# Patient Record
Sex: Female | Born: 1960 | Race: White | Hispanic: No | Marital: Married | Smoking: Current every day smoker
Health system: Southern US, Community
[De-identification: ages and names within clinical notes are randomized; demographics above are authoritative.]

## PROBLEM LIST (undated history)

## (undated) DIAGNOSIS — Z972 Presence of dental prosthetic device (complete) (partial): Secondary | ICD-10-CM

## (undated) DIAGNOSIS — E119 Type 2 diabetes mellitus without complications: Secondary | ICD-10-CM

## (undated) DIAGNOSIS — D649 Anemia, unspecified: Secondary | ICD-10-CM

## (undated) DIAGNOSIS — R51 Headache: Secondary | ICD-10-CM

## (undated) DIAGNOSIS — J343 Hypertrophy of nasal turbinates: Secondary | ICD-10-CM

## (undated) DIAGNOSIS — J320 Chronic maxillary sinusitis: Secondary | ICD-10-CM

## (undated) DIAGNOSIS — D72829 Elevated white blood cell count, unspecified: Secondary | ICD-10-CM

## (undated) DIAGNOSIS — K2270 Barrett's esophagus without dysplasia: Secondary | ICD-10-CM

## (undated) DIAGNOSIS — E782 Mixed hyperlipidemia: Secondary | ICD-10-CM

## (undated) DIAGNOSIS — R519 Headache, unspecified: Secondary | ICD-10-CM

## (undated) DIAGNOSIS — J322 Chronic ethmoidal sinusitis: Secondary | ICD-10-CM

## (undated) HISTORY — DX: Mixed hyperlipidemia: E78.2

## (undated) HISTORY — PX: SHOULDER ARTHROSCOPY: SHX128

---

## 1994-10-24 HISTORY — PX: VESICOVAGINAL FISTULA CLOSURE W/ TAH: SUR271

## 1999-04-26 ENCOUNTER — Ambulatory Visit (HOSPITAL_BASED_OUTPATIENT_CLINIC_OR_DEPARTMENT_OTHER): Admission: RE | Admit: 1999-04-26 | Discharge: 1999-04-26 | Payer: Self-pay | Admitting: Orthopedic Surgery

## 1999-05-12 ENCOUNTER — Encounter: Admission: RE | Admit: 1999-05-12 | Discharge: 1999-05-24 | Payer: Self-pay | Admitting: Orthopedic Surgery

## 2000-05-05 ENCOUNTER — Encounter: Payer: Self-pay | Admitting: Orthopedic Surgery

## 2000-05-05 ENCOUNTER — Ambulatory Visit (HOSPITAL_COMMUNITY): Admission: RE | Admit: 2000-05-05 | Discharge: 2000-05-05 | Payer: Self-pay | Admitting: Orthopedic Surgery

## 2002-09-09 ENCOUNTER — Encounter: Payer: Self-pay | Admitting: Orthopedic Surgery

## 2002-09-09 ENCOUNTER — Encounter: Admission: RE | Admit: 2002-09-09 | Discharge: 2002-09-09 | Payer: Self-pay | Admitting: Orthopedic Surgery

## 2003-09-07 ENCOUNTER — Emergency Department (HOSPITAL_COMMUNITY): Admission: EM | Admit: 2003-09-07 | Discharge: 2003-09-07 | Payer: Self-pay | Admitting: Emergency Medicine

## 2003-09-23 ENCOUNTER — Encounter: Admission: RE | Admit: 2003-09-23 | Discharge: 2003-09-23 | Payer: Self-pay | Admitting: Family Medicine

## 2003-10-01 ENCOUNTER — Encounter: Admission: RE | Admit: 2003-10-01 | Discharge: 2003-10-01 | Payer: Self-pay | Admitting: Obstetrics and Gynecology

## 2004-03-30 ENCOUNTER — Emergency Department (HOSPITAL_COMMUNITY): Admission: EM | Admit: 2004-03-30 | Discharge: 2004-03-30 | Payer: Self-pay

## 2004-04-19 ENCOUNTER — Observation Stay (HOSPITAL_COMMUNITY): Admission: RE | Admit: 2004-04-19 | Discharge: 2004-04-19 | Payer: Self-pay | Admitting: Surgery

## 2004-04-19 ENCOUNTER — Encounter (INDEPENDENT_AMBULATORY_CARE_PROVIDER_SITE_OTHER): Payer: Self-pay | Admitting: Specialist

## 2004-04-19 HISTORY — PX: LAPAROSCOPIC CHOLECYSTECTOMY: SUR755

## 2007-05-25 ENCOUNTER — Other Ambulatory Visit: Admission: RE | Admit: 2007-05-25 | Discharge: 2007-05-25 | Payer: Self-pay | Admitting: Family Medicine

## 2009-06-26 ENCOUNTER — Encounter: Admission: RE | Admit: 2009-06-26 | Discharge: 2009-06-26 | Payer: Self-pay | Admitting: Family Medicine

## 2010-05-14 ENCOUNTER — Encounter: Payer: Self-pay | Admitting: Cardiology

## 2010-05-24 DIAGNOSIS — IMO0001 Reserved for inherently not codable concepts without codable children: Secondary | ICD-10-CM | POA: Insufficient documentation

## 2010-05-24 DIAGNOSIS — R1031 Right lower quadrant pain: Secondary | ICD-10-CM

## 2010-05-24 DIAGNOSIS — E119 Type 2 diabetes mellitus without complications: Secondary | ICD-10-CM | POA: Insufficient documentation

## 2010-05-24 DIAGNOSIS — IMO0002 Reserved for concepts with insufficient information to code with codable children: Secondary | ICD-10-CM | POA: Insufficient documentation

## 2010-05-24 DIAGNOSIS — J309 Allergic rhinitis, unspecified: Secondary | ICD-10-CM | POA: Insufficient documentation

## 2010-05-24 DIAGNOSIS — R110 Nausea: Secondary | ICD-10-CM | POA: Insufficient documentation

## 2010-05-24 DIAGNOSIS — E782 Mixed hyperlipidemia: Secondary | ICD-10-CM

## 2010-05-24 DIAGNOSIS — M25519 Pain in unspecified shoulder: Secondary | ICD-10-CM | POA: Insufficient documentation

## 2010-05-24 DIAGNOSIS — R079 Chest pain, unspecified: Secondary | ICD-10-CM | POA: Insufficient documentation

## 2010-05-24 DIAGNOSIS — G479 Sleep disorder, unspecified: Secondary | ICD-10-CM | POA: Insufficient documentation

## 2010-05-26 ENCOUNTER — Encounter: Payer: Self-pay | Admitting: Cardiology

## 2010-05-26 ENCOUNTER — Ambulatory Visit: Payer: Self-pay | Admitting: Cardiology

## 2010-05-26 DIAGNOSIS — F17200 Nicotine dependence, unspecified, uncomplicated: Secondary | ICD-10-CM

## 2010-06-02 ENCOUNTER — Telehealth (INDEPENDENT_AMBULATORY_CARE_PROVIDER_SITE_OTHER): Payer: Self-pay | Admitting: *Deleted

## 2010-06-03 ENCOUNTER — Encounter (HOSPITAL_COMMUNITY): Admission: RE | Admit: 2010-06-03 | Discharge: 2010-07-26 | Payer: Self-pay | Admitting: Cardiology

## 2010-06-03 ENCOUNTER — Ambulatory Visit: Payer: Self-pay | Admitting: Internal Medicine

## 2010-06-03 ENCOUNTER — Encounter: Payer: Self-pay | Admitting: Internal Medicine

## 2010-06-03 ENCOUNTER — Ambulatory Visit: Payer: Self-pay

## 2010-07-21 ENCOUNTER — Encounter: Admission: RE | Admit: 2010-07-21 | Discharge: 2010-07-21 | Payer: Self-pay | Admitting: Family Medicine

## 2010-07-29 ENCOUNTER — Encounter: Admission: RE | Admit: 2010-07-29 | Discharge: 2010-07-29 | Payer: Self-pay | Admitting: Family Medicine

## 2010-11-23 NOTE — Assessment & Plan Note (Signed)
Summary: [facility]   Visit Type:  Initial Consult  CC:  chest pain.  History of Present Illness: 50 year old female for evaluation of chest pain. No prior cardiac history. She does have some dyspnea on exertion relieved with rest. There is no orthopnea, PND, pedal edema, palpitations or syncope. Over the past one and one half years she has noticed occasional pain under the left breast. It is described as a stabbing pain. It does not radiate. There is associated shortness of breath and diaphoresis but no nausea. It lasts 5 seconds and resolves spontaneously. It is not pleuritic but it can occur with change in position. It resolves spontaneously. Because of the above were asked to further evaluate.  Current Medications (verified): 1)  Actos 15 Mg Tabs (Pioglitazone Hcl) .... Take 1 Tablet By Mouth Two Times A Day 2)  Ibuprofen 200 Mg Tabs (Ibuprofen) .... As Needed  Allergies (verified): No Known Drug Allergies  Past History:  Past Medical History: HYPERLIPIDEMIA, MIXED  UNSPECIFIED SLEEP DISTURBANCE DM  H/O pneumonia/bronchitis  Past Surgical History: Laparoscopic cholecystectomy. hysterectomy Right Shoulder surgery x 4  Family History: Reviewed history and no changes required. Father with CAD (MI in 34s) 2 sisters with MI prior to age 93  Social History: Reviewed history from 05/24/2010 and no changes required. Full Time Tobacco Use - Yes.  Drug Use - rarely Married   Review of Systems       no fevers or chills, productive cough, hemoptysis, dysphasia, odynophagia, melena, hematochezia, dysuria, hematuria, rash, seizure activity, orthopnea, PND, pedal edema, claudication. Remaining systems are negative.   Vital Signs:  Patient profile:   50 year old female Height:      61 inches Weight:      147 pounds BMI:     27.88 Pulse rate:   99 / minute Pulse rhythm:   regular Resp:     18 per minute BP sitting:   120 / 62  (left arm) Cuff size:    large  Vitals Entered By: Vikki Ports (May 26, 2010 9:26 AM)  Physical Exam  General:  Well developed/well nourished in NAD Skin warm/dry Patient not depressed No peripheral clubbing Back-normal HEENT-normal/normal eyelids Neck Riggs Dineen/normal carotid upstroke bilaterally; no bruits; no JVD; no thyromegaly chest - CTA/ normal expansion CV - RRR/normal S1 and S2; no murmurs, rubs or gallops;  PMI nondisplaced Abdomen -NT/ND, no HSM, no mass, + bowel sounds, no bruit 2+ femoral pulses, no bruits Ext-no edema, chords, 2+ DP Neuro-grossly nonfocal     EKG  Procedure date:  05/26/2010  Findings:      Sinus rhythm at a rate of 99. No ST changes.  Impression & Recommendations:  Problem # 1:  CHEST PAIN (ICD-786.50) Symptoms atypical. However multiple risk factors including tobacco use, diabetes and strong family history. Plan to proceed with stress Myoview for risk stratification. If negative no further cardiac workup. I have stressed the importance of risk factor modification including diet and exercise. Orders: Nuclear Stress Test (Nuc Stress Test)  Problem # 2:  TOBACCO ABUSE (ICD-305.1) Patient counseled on discontinuing for between 3-10 minutes.  Problem # 3:  HYPERLIPIDEMIA, MIXED (ICD-272.2) Management per primary care.  Problem # 4:  DM (ICD-250.00)  Her updated medication list for this problem includes:    Actos 15 Mg Tabs (Pioglitazone hcl) .Marland Kitchen... Take 1 tablet by mouth two times a day  Patient Instructions: 1)  Your physician recommends that you schedule a follow-up appointment in: AS NEEDED PENDING TEST RESULTS  2)  Your physician has requested that you have an exercise stress myoview.  For further information please visit [email or web address]  Please follow instruction sheet, as given.

## 2010-11-23 NOTE — Progress Notes (Signed)
Summary: Nuc Pre-Procedure  Phone Note Outgoing Call   Call placed by: Antionette Char RN,  June 02, 2010 2:03 PM Call placed to: Patient Reason for Call: Confirm/change Appt Summary of Call: Left message with information on Myoview Information Sheet (see scanned document for details).     Nuclear Med Background Indications for Stress Test: Evaluation for Ischemia     Symptoms: Chest Pain, Diaphoresis, DOE    Nuclear Pre-Procedure Cardiac Risk Factors: Family History - CAD, Lipids, NIDDM, Smoker Height (in): 61

## 2010-11-23 NOTE — Assessment & Plan Note (Signed)
Summary: Cardiology Nuclear Testing  Nuclear Med Background Indications for Stress Test: Evaluation for Ischemia    History Comments: NO DOCUMENTED CAD  Symptoms: Chest Pain, Chest Pressure, Diaphoresis, DOE, Fatigue, Fatigue with Exertion  Symptoms Comments: Last episode of CP:2 days ago.   Nuclear Pre-Procedure Cardiac Risk Factors: Family History - CAD, Lipids, NIDDM, Smoker Caffeine/Decaff Intake: None NPO After: 6:00 AM Lungs: Clear.  O2 Sat 98% on RA. IV 0.9% NS with Angio Cath: 22g     IV Site: Rt Hand IV Started by: Bonnita Levan RN Chest Size (in) 36     Cup Size C     Height (in): 61 Weight (lb): 148 BMI: 28.07  Nuclear Med Study 1 or 2 day study:  1 day     Stress Test Type:  Stress Reading MD:  Arvilla Meres, MD     Referring MD:  Olga Millers, MD Resting Radionuclide:  Technetium 1m Tetrofosmin     Resting Radionuclide Dose:  10.0 mCi  Stress Radionuclide:  Technetium 31m Tetrofosmin     Stress Radionuclide Dose:  32.9 mCi   Stress Protocol Exercise Time (min):  7:00 min     Max HR:  164 bpm     Predicted Max HR:  171 bpm  Max Systolic BP: 171 mm Hg     Percent Max HR:  95.91 %     METS: 8.5 Rate Pressure Product:  16109    Stress Test Technologist:  Rea College CMA-N     Nuclear Technologist:  Harlow Asa CNMT  Rest Procedure  Myocardial perfusion imaging was performed at rest 45 minutes following the intravenous administration of Myoview Technetium 67m Tetrofosmin.  Stress Procedure  The patient exercised for seven minutes.  The patient stopped due to fatigue and denied any chest pain.  There were no significant ST-T wave changes.  Myoview was injected at peak exercise and myocardial perfusion imaging was performed after a brief delay.  QPS Raw Data Images:  Normal; no motion artifact; normal heart/lung ratio. Stress Images:  Decreased uptake in the anterior wall Rest Images:  Decreased uptake in the anterior wall Subtraction (SDS):  There is  an essentially fixed anterior defect that is most consistent with breast attenuation. Transient Ischemic Dilatation:  1.10  (Normal <1.22)  Lung/Heart Ratio:  0.33  (Normal <0.45)  Quantitative Gated Spect Images QGS EDV:  47 ml QGS ESV:  11 ml QGS EF:  78 % QGS cine images:  Normal  Findings Normal nuclear study      Overall Impression  Exercise Capacity: Fair exercise capacity. BP Response: Normal blood pressure response. Clinical Symptoms: No chest pain ECG Impression: No significant ST segment change suggestive of ischemia. Overall Impression: Normal stress nuclear study. Overall Impression Comments: There is an essentially fixed anterior defect that is most consistent with breast attenuation.  Appended Document: Cardiology Nuclear Testing ok  Appended Document: Cardiology Nuclear Testing Left message to call back    Appended Document: Cardiology Nuclear Testing pt aware of results

## 2010-11-23 NOTE — Consult Note (Signed)
Summary: [facility] Referral   Imported By: Roderic Ovens 06/02/2010 11:42:58  _____________________________________________________________________  External Attachment:    Type:   Image     Comment:   External Document

## 2011-03-11 NOTE — Op Note (Signed)
NAME:  Kristin Livingston, Kristin Livingston                         ACCOUNT NO.:  [id number]   MEDICAL RECORD NO.:  [id number]                   PATIENT TYPE:  AMB   LOCATION:  DAY                                  FACILITY:  [facility]   PHYSICIAN:  Abigail Miyamoto, M.D.              DATE OF BIRTH:  [birth year]   DATE OF PROCEDURE:  04/19/2004  DATE OF DISCHARGE:                                 OPERATIVE REPORT   PREOPERATIVE DIAGNOSIS:  Symptomatic cholelithiasis.   POSTOPERATIVE DIAGNOSIS:  Symptomatic cholelithiasis.   PROCEDURE:  Laparoscopic cholecystectomy.   SURGEON:  Abigail Miyamoto, M.D.   ASSISTANT:  Rose Phi. Maple Hudson, M.D.   ANESTHESIA:  General endotracheal anesthesia.   ESTIMATED BLOOD LOSS:  Minimal.   PROCEDURE IN DETAIL:  The patient was brought to the operating room and  identified as [name].  She was placed supine on the operating room  table, and general anesthesia was induced.  The patient's abdomen was then  prepped and draped in the usual sterile fashion.  Using a #15 blade, a small  transverse incision was made below the umbilicus.  This incision was carried  down to the fascia, which was then opened with a scalpel.  The hemostat was  then used to pass through the peritoneal cavity.  Next, a 0 Vicryl purse-  string suture was placed around the fascial opening.  The Hasson port was  placed through the opening, and insufflation of the abdomen was begun.  A 12  mm port was placed in the epigastrium, and two 5 mm ports were placed in the  patient's right flank under direct vision.  The gallbladder was then  identified and retract above the liver bed.  Several adhesions to the  gallbladder were then taken down bluntly.  The base of the gallbladder was  then dissected out.  The cystic duct was easily dissected out, then a  critical window was achieved circumferentially around this.  It was then  clipped three times proximally and once distally and transected with  scissors.  The  cystic artery was then identified, clipped twice and once  distally, and transected as well.  The gallbladder was then slowly dissected  free from the liver bed with the electrocautery.  Once it was free from the  liver bed, hemostasis was easily achieved with cautery.  The gallbladder was  then removed through the incision at the umbilicus.  Then ovary at the  umbilicus was tied in place, closing the fascial defect.  The abdomen was  then irrigated with normal saline.  Again, the liver bed was examined, and  hemostasis was felt to be achieved.  All ports were then removed under  direct vision, and the abdomen was deflated.  All incisions were then  anesthetized with 0.25% Marcaine and closed with 4-0 Monocryl subcuticular  sutures.  Steri-Strips, gauze, and tape were then applied.  The patient  tolerated the procedure well.  All sponge, needle, and instrument counts  were correct at the end of the procedure.  Patient was then extubated in the  operating room and taken in stable condition to the recovery room.                                               Abigail Miyamoto, M.D.   DB/MEDQ  D:  04/19/2004  T:  04/19/2004  Job:  [id number]

## 2011-06-02 ENCOUNTER — Encounter: Payer: Self-pay | Admitting: Cardiology

## 2012-01-30 ENCOUNTER — Other Ambulatory Visit: Payer: Self-pay | Admitting: Family Medicine

## 2012-01-30 DIAGNOSIS — Z1231 Encounter for screening mammogram for malignant neoplasm of breast: Secondary | ICD-10-CM

## 2012-02-02 ENCOUNTER — Ambulatory Visit
Admission: RE | Admit: 2012-02-02 | Discharge: 2012-02-02 | Disposition: A | Discharge status: Home / Self Care | Payer: BC Managed Care – PPO | Source: Ambulatory Visit | Attending: Family Medicine | Admitting: Family Medicine

## 2012-02-02 DIAGNOSIS — Z1231 Encounter for screening mammogram for malignant neoplasm of breast: Secondary | ICD-10-CM

## 2013-08-25 ENCOUNTER — Emergency Department (HOSPITAL_BASED_OUTPATIENT_CLINIC_OR_DEPARTMENT_OTHER)
Admission: EM | Admit: 2013-08-25 | Discharge: 2013-08-25 | Disposition: A | Discharge status: Home / Self Care | Payer: BC Managed Care – PPO | Attending: Emergency Medicine | Admitting: Emergency Medicine

## 2013-08-25 ENCOUNTER — Emergency Department (HOSPITAL_BASED_OUTPATIENT_CLINIC_OR_DEPARTMENT_OTHER): Payer: BC Managed Care – PPO

## 2013-08-25 ENCOUNTER — Encounter (HOSPITAL_BASED_OUTPATIENT_CLINIC_OR_DEPARTMENT_OTHER): Payer: Self-pay | Admitting: Emergency Medicine

## 2013-08-25 DIAGNOSIS — S79919A Unspecified injury of unspecified hip, initial encounter: Secondary | ICD-10-CM | POA: Insufficient documentation

## 2013-08-25 DIAGNOSIS — Z8701 Personal history of pneumonia (recurrent): Secondary | ICD-10-CM | POA: Insufficient documentation

## 2013-08-25 DIAGNOSIS — Y939 Activity, unspecified: Secondary | ICD-10-CM | POA: Insufficient documentation

## 2013-08-25 DIAGNOSIS — E119 Type 2 diabetes mellitus without complications: Secondary | ICD-10-CM | POA: Insufficient documentation

## 2013-08-25 DIAGNOSIS — F17200 Nicotine dependence, unspecified, uncomplicated: Secondary | ICD-10-CM | POA: Insufficient documentation

## 2013-08-25 DIAGNOSIS — R296 Repeated falls: Secondary | ICD-10-CM | POA: Insufficient documentation

## 2013-08-25 DIAGNOSIS — M25552 Pain in left hip: Secondary | ICD-10-CM

## 2013-08-25 DIAGNOSIS — S8990XA Unspecified injury of unspecified lower leg, initial encounter: Secondary | ICD-10-CM | POA: Insufficient documentation

## 2013-08-25 DIAGNOSIS — Z862 Personal history of diseases of the blood and blood-forming organs and certain disorders involving the immune mechanism: Secondary | ICD-10-CM | POA: Insufficient documentation

## 2013-08-25 DIAGNOSIS — Y929 Unspecified place or not applicable: Secondary | ICD-10-CM | POA: Insufficient documentation

## 2013-08-25 DIAGNOSIS — Z79899 Other long term (current) drug therapy: Secondary | ICD-10-CM | POA: Insufficient documentation

## 2013-08-25 DIAGNOSIS — Z8639 Personal history of other endocrine, nutritional and metabolic disease: Secondary | ICD-10-CM | POA: Insufficient documentation

## 2013-08-25 DIAGNOSIS — M25562 Pain in left knee: Secondary | ICD-10-CM

## 2013-08-25 MED ORDER — CYCLOBENZAPRINE HCL 10 MG PO TABS: 10.0000 mg | ORAL_TABLET | Freq: Two times a day (BID) | ORAL | Status: DC | PRN

## 2013-08-25 MED ORDER — DIAZEPAM 5 MG PO TABS
5.0000 mg | ORAL_TABLET | Freq: Once | ORAL | Status: AC
  Administered 2013-08-25: 5 mg via ORAL
  Filled 2013-08-25: qty 1

## 2013-08-25 MED ORDER — KETOROLAC TROMETHAMINE 60 MG/2ML IM SOLN
60.0000 mg | Freq: Once | INTRAMUSCULAR | Status: AC
  Administered 2013-08-25: 60 mg via INTRAMUSCULAR
  Filled 2013-08-25: qty 2

## 2013-08-25 MED ORDER — PREDNISONE 20 MG PO TABS: 40.0000 mg | ORAL_TABLET | Freq: Every day | ORAL | Status: DC

## 2013-08-25 MED ORDER — TRAMADOL HCL 50 MG PO TABS
50.0000 mg | ORAL_TABLET | Freq: Four times a day (QID) | ORAL | Status: DC | PRN
Start: 2013-08-25 — End: 2017-11-13

## 2013-08-25 NOTE — ED Notes (Signed)
Pt reports pain from left hip down to ankle, fell about a week ago on left leg and it's been getting worse since, feels like muscle is having spasms even when rubbing it.

## 2013-08-25 NOTE — ED Provider Notes (Signed)
CSN: [id number]     Arrival date & time 08/25/13  1427 History   First MD Initiated Contact with Patient 08/25/13 1433     Chief Complaint  Patient presents with  . Back Pain   (Consider location/radiation/quality/duration/timing/severity/associated sxs/prior Treatment) HPI Comments: Patient is a 52 year old female with a past medical history of diabetes who presents with left hip pain for the past 2 weeks. Symptoms started gradually and progressively worsened since the onset. Patient reports falling 1 week ago and landing on her left hip, which made the pain worse. The pain is aching and severe and radiates down her left leg. Patient has been taking ibuprofen for pain which provides no relief. Palpation of the leg makes the pain worse. No alleviating factors. No other associated symptoms. Patient reports having an appointment with Orthopedist, Dr. Magnus Ivan, in 3 days for her pain.   Patient is a 52 y.o. female presenting with back pain.  Back Pain   Past Medical History  Diagnosis Date  . Mixed hyperlipidemia   . Sleep disturbance, unspecified   . DM (diabetes mellitus)   . Pneumonia   . Bronchitis    Past Surgical History  Procedure Laterality Date  . Laparoscopic cholecystectomy    . Vesicovaginal fistula closure w/ tah    . Right shoulder surgery     Family History  Problem Relation Age of Onset  . Coronary artery disease Father   . Heart attack Sister     x2 prior to age 49   History  Substance Use Topics  . Smoking status: Current Every Day Smoker -- 1.00 packs/day    Types: Cigarettes  . Smokeless tobacco: Not on file  . Alcohol Use: No   OB History   Grav Para Term Preterm Abortions TAB SAB Ect Mult Living                 Review of Systems  Musculoskeletal: Positive for arthralgias, back pain and myalgias.  All other systems reviewed and are negative.    Allergies  Review of patient's allergies indicates no known allergies.  Home Medications   Current  Outpatient Rx  Name  Route  Sig  Dispense  Refill  . ibuprofen (ADVIL,MOTRIN) 200 MG tablet   Oral   Take 200 mg by mouth every 6 (six) hours as needed.           . pioglitazone (ACTOS) 15 MG tablet   Oral   Take 15 mg by mouth 2 (two) times daily.            BP 138/92  Pulse 104  Temp(Src) 97.5 F (36.4 C) (Oral)  Resp 14  Ht 5' (1.524 m)  Wt 114 lb (51.71 kg)  BMI 22.26 kg/m2  SpO2 99% Physical Exam  Nursing note and vitals reviewed. Constitutional: She appears well-developed and well-nourished. No distress.  HENT:  Head: Normocephalic and atraumatic.  Eyes: Conjunctivae are normal.  Neck: Normal range of motion.  Cardiovascular: Normal rate and regular rhythm.  Exam reveals no gallop and no friction rub.   No murmur heard. Pulmonary/Chest: Effort normal and breath sounds normal. She has no wheezes. She has no rales. She exhibits no tenderness.  Musculoskeletal: Normal range of motion.  No midline spine tenderness to palpation. Left paraspinal sacral tenderness to palpation. Generalized left thigh and left lower leg tenderness to palpation. No obvious deformity.   Neurological: She is alert.  Speech is goal-oriented. Moves limbs without ataxia.   Skin: Skin  is warm and dry.  Psychiatric: She has a normal mood and affect. Her behavior is normal.    ED Course  Procedures (including critical care time) Labs Review Labs Reviewed - No data to display Imaging Review Dg Hip Complete Left  08/25/2013   CLINICAL DATA:  Fall. Left hip injury and pain.  EXAM: LEFT HIP - COMPLETE 2+ VIEW  COMPARISON:  None.  FINDINGS: There is no evidence of hip fracture or dislocation. There is no evidence of arthropathy or other focal bone abnormality.  IMPRESSION: Negative.   Electronically Signed   By: Myles Rosenthal M.D.   On: 08/25/2013 15:06   Dg Knee Complete 4 Views Left  08/25/2013   CLINICAL DATA:  Fall. Left knee injury and pain.  EXAM: LEFT KNEE - COMPLETE 4+ VIEW  COMPARISON:   None.  FINDINGS: There is no evidence of fracture, dislocation, or joint effusion. There is no evidence of arthropathy or other focal bone abnormality. Soft tissues are unremarkable.  IMPRESSION: Negative.   Electronically Signed   By: Myles Rosenthal M.D.   On: 08/25/2013 15:06    EKG Interpretation   None       MDM   1. Left hip pain   2. Left knee pain     2:55 PM Xray of left hip and knee pending. Patient will have toradol and valium for symptoms. No neurovascular compromise. Vitals stable and patient afebrile.  3:38 PM Xray unremarkable for acute changes. Patient will be discharged with Tramadol and Prednisone. Patient will follow up with Dr. Magnus Ivan this week.     Emilia Beck, PA-C 08/25/13 1542

## 2013-08-27 ENCOUNTER — Other Ambulatory Visit: Payer: Self-pay | Admitting: Orthopaedic Surgery

## 2013-08-27 DIAGNOSIS — M545 Low back pain: Secondary | ICD-10-CM

## 2013-08-30 ENCOUNTER — Other Ambulatory Visit: Payer: BC Managed Care – PPO

## 2013-08-30 ENCOUNTER — Ambulatory Visit
Admission: RE | Admit: 2013-08-30 | Discharge: 2013-08-30 | Disposition: A | Discharge status: Home / Self Care | Payer: BC Managed Care – PPO | Source: Ambulatory Visit | Attending: Orthopaedic Surgery | Admitting: Orthopaedic Surgery

## 2013-08-30 DIAGNOSIS — M545 Low back pain: Secondary | ICD-10-CM

## 2013-09-02 NOTE — ED Provider Notes (Signed)
Medical screening examination/treatment/procedure(s) were performed by non-physician practitioner and as supervising physician I was immediately available for consultation/collaboration.  EKG Interpretation   None         Amit Leece R Kayci [place], MD 09/02/13 1623 

## 2014-01-10 ENCOUNTER — Other Ambulatory Visit: Payer: Self-pay | Admitting: Family Medicine

## 2014-01-10 DIAGNOSIS — Z1231 Encounter for screening mammogram for malignant neoplasm of breast: Secondary | ICD-10-CM

## 2014-01-29 ENCOUNTER — Ambulatory Visit: Payer: BC Managed Care – PPO

## 2014-02-12 ENCOUNTER — Encounter (INDEPENDENT_AMBULATORY_CARE_PROVIDER_SITE_OTHER): Payer: Self-pay

## 2014-02-12 ENCOUNTER — Ambulatory Visit
Admission: RE | Admit: 2014-02-12 | Discharge: 2014-02-12 | Disposition: A | Discharge status: Home / Self Care | Payer: Self-pay | Source: Ambulatory Visit | Attending: Family Medicine | Admitting: Family Medicine

## 2014-02-12 DIAGNOSIS — Z1231 Encounter for screening mammogram for malignant neoplasm of breast: Secondary | ICD-10-CM

## 2015-01-07 ENCOUNTER — Other Ambulatory Visit: Payer: Self-pay

## 2015-01-07 DIAGNOSIS — Z1231 Encounter for screening mammogram for malignant neoplasm of breast: Secondary | ICD-10-CM

## 2015-02-17 ENCOUNTER — Ambulatory Visit
Admission: RE | Admit: 2015-02-17 | Discharge: 2015-02-17 | Disposition: A | Discharge status: Home / Self Care | Payer: BLUE CROSS/BLUE SHIELD | Source: Ambulatory Visit

## 2015-02-17 DIAGNOSIS — Z1231 Encounter for screening mammogram for malignant neoplasm of breast: Secondary | ICD-10-CM

## 2015-02-25 ENCOUNTER — Ambulatory Visit: Payer: Self-pay

## 2015-07-01 ENCOUNTER — Emergency Department (HOSPITAL_COMMUNITY): Payer: BLUE CROSS/BLUE SHIELD

## 2015-07-01 ENCOUNTER — Encounter (HOSPITAL_COMMUNITY): Payer: Self-pay | Admitting: Family Medicine

## 2015-07-01 ENCOUNTER — Observation Stay (HOSPITAL_COMMUNITY)
Admission: EM | Admit: 2015-07-01 | Discharge: 2015-07-02 | Disposition: A | Discharge status: Home / Self Care | Payer: BLUE CROSS/BLUE SHIELD | Attending: Internal Medicine | Admitting: Internal Medicine

## 2015-07-01 DIAGNOSIS — F17210 Nicotine dependence, cigarettes, uncomplicated: Secondary | ICD-10-CM | POA: Diagnosis not present

## 2015-07-01 DIAGNOSIS — K219 Gastro-esophageal reflux disease without esophagitis: Secondary | ICD-10-CM | POA: Insufficient documentation

## 2015-07-01 DIAGNOSIS — Z8249 Family history of ischemic heart disease and other diseases of the circulatory system: Secondary | ICD-10-CM | POA: Insufficient documentation

## 2015-07-01 DIAGNOSIS — J449 Chronic obstructive pulmonary disease, unspecified: Secondary | ICD-10-CM | POA: Insufficient documentation

## 2015-07-01 DIAGNOSIS — E782 Mixed hyperlipidemia: Secondary | ICD-10-CM | POA: Diagnosis present

## 2015-07-01 DIAGNOSIS — R0789 Other chest pain: Secondary | ICD-10-CM | POA: Diagnosis not present

## 2015-07-01 DIAGNOSIS — E119 Type 2 diabetes mellitus without complications: Secondary | ICD-10-CM | POA: Insufficient documentation

## 2015-07-01 DIAGNOSIS — R079 Chest pain, unspecified: Secondary | ICD-10-CM | POA: Diagnosis present

## 2015-07-01 DIAGNOSIS — F419 Anxiety disorder, unspecified: Secondary | ICD-10-CM | POA: Diagnosis not present

## 2015-07-01 DIAGNOSIS — Z720 Tobacco use: Secondary | ICD-10-CM | POA: Insufficient documentation

## 2015-07-01 LAB — COMPREHENSIVE METABOLIC PANEL
ALT: 16 U/L (ref 14–54)
ANION GAP: 9 (ref 5–15)
AST: 19 U/L (ref 15–41)
Albumin: 4 g/dL (ref 3.5–5.0)
Alkaline Phosphatase: 102 U/L (ref 38–126)
BILIRUBIN TOTAL: 0.4 mg/dL (ref 0.3–1.2)
BUN: 9 mg/dL (ref 6–20)
CHLORIDE: 107 mmol/L (ref 101–111)
CO2: 22 mmol/L (ref 22–32)
Calcium: 9.2 mg/dL (ref 8.9–10.3)
Creatinine, Ser: 0.6 mg/dL (ref 0.44–1.00)
Glucose, Bld: 109 mg/dL — ABNORMAL HIGH (ref 65–99)
POTASSIUM: 3.8 mmol/L (ref 3.5–5.1)
Sodium: 138 mmol/L (ref 135–145)
TOTAL PROTEIN: 7.8 g/dL (ref 6.5–8.1)

## 2015-07-01 LAB — GLUCOSE, CAPILLARY: Glucose-Capillary: 127 mg/dL — ABNORMAL HIGH (ref 65–99)

## 2015-07-01 LAB — I-STAT TROPONIN, ED: TROPONIN I, POC: 0 ng/mL (ref 0.00–0.08)

## 2015-07-01 LAB — BASIC METABOLIC PANEL
ANION GAP: 10 (ref 5–15)
BUN: 9 mg/dL (ref 6–20)
CALCIUM: 9.3 mg/dL (ref 8.9–10.3)
CO2: 25 mmol/L (ref 22–32)
CREATININE: 0.7 mg/dL (ref 0.44–1.00)
Chloride: 103 mmol/L (ref 101–111)
GFR calc Af Amer: >60 mL/min (ref >60)
GLUCOSE: 187 mg/dL — AB (ref 65–99)
Potassium: 4.8 mmol/L (ref 3.5–5.1)
Sodium: 138 mmol/L (ref 135–145)

## 2015-07-01 LAB — CBC
HCT: 47.2 % — ABNORMAL HIGH (ref 36.0–46.0)
HEMOGLOBIN: 15.4 g/dL — AB (ref 12.0–15.0)
MCH: 29.5 pg (ref 26.0–34.0)
MCHC: 32.6 g/dL (ref 30.0–36.0)
MCV: 90.4 fL (ref 78.0–100.0)
PLATELETS: 405 10*3/uL — AB (ref 150–400)
RBC: 5.22 MIL/uL — ABNORMAL HIGH (ref 3.87–5.11)
RDW: 12.8 % (ref 11.5–15.5)
WBC: 11.4 10*3/uL — ABNORMAL HIGH (ref 4.0–10.5)

## 2015-07-01 LAB — BRAIN NATRIURETIC PEPTIDE: B NATRIURETIC PEPTIDE 5: 10.9 pg/mL (ref 0.0–100.0)

## 2015-07-01 LAB — TROPONIN I

## 2015-07-01 MED ORDER — ONDANSETRON HCL 4 MG/2ML IJ SOLN: 4.0000 mg | Freq: Four times a day (QID) | INTRAMUSCULAR | Status: DC | PRN

## 2015-07-01 MED ORDER — INSULIN ASPART 100 UNIT/ML ~~LOC~~ SOLN
0.0000 [IU] | SUBCUTANEOUS | Status: DC
  Administered 2015-07-01: 1 [IU] via SUBCUTANEOUS
  Administered 2015-07-02: 3 [IU] via SUBCUTANEOUS
  Administered 2015-07-02: 1 [IU] via SUBCUTANEOUS
  Administered 2015-07-02: 2 [IU] via SUBCUTANEOUS

## 2015-07-01 MED ORDER — MORPHINE SULFATE (PF) 2 MG/ML IV SOLN
2.0000 mg | INTRAVENOUS | Status: DC | PRN
  Administered 2015-07-01 – 2015-07-02 (×2): 2 mg via INTRAVENOUS
  Filled 2015-07-01 (×2): qty 1

## 2015-07-01 MED ORDER — ACETAMINOPHEN 325 MG PO TABS: 650.0000 mg | ORAL_TABLET | ORAL | Status: DC | PRN

## 2015-07-01 MED ORDER — ENOXAPARIN SODIUM 40 MG/0.4ML ~~LOC~~ SOLN
40.0000 mg | SUBCUTANEOUS | Status: DC
  Administered 2015-07-01: 40 mg via SUBCUTANEOUS
  Filled 2015-07-01 (×2): qty 0.4

## 2015-07-01 MED ORDER — NITROGLYCERIN 0.4 MG SL SUBL
0.4000 mg | SUBLINGUAL_TABLET | SUBLINGUAL | Status: DC | PRN
  Administered 2015-07-01 (×6): 0.4 mg via SUBLINGUAL
  Filled 2015-07-01 (×2): qty 1

## 2015-07-01 MED ORDER — ASPIRIN 81 MG PO CHEW
324.0000 mg | CHEWABLE_TABLET | Freq: Once | ORAL | Status: AC
  Administered 2015-07-01: 324 mg via ORAL
  Filled 2015-07-01: qty 4

## 2015-07-01 NOTE — ED Provider Notes (Signed)
CSN: [id number]     Arrival date & time 07/01/15  1500 History   First MD Initiated Contact with Patient 07/01/15 1654     Chief Complaint  Patient presents with  . Chest Pain   Patient is a 54 y.o. female presenting with general illness. The history is provided by the patient. No language interpreter was used.  Illness Location:  Chest Quality:  Pain Severity:  Moderate Onset quality:  Gradual Timing:  Constant Progression:  Waxing and waning Chronicity:  New Context:  PMHx of HLD, HTN, DM, and TOB use (0.5 PPD x30 yrs) presenting with CP. Onset at rest. Radiating to left neck and left arm. Worse with exertion. Associated with nausea and diaphoresis. FHx MI at 35. Associated symptoms: chest pain and nausea   Associated symptoms: no congestion, no cough, no diarrhea, no fever, no shortness of breath and no vomiting     Past Medical History  Diagnosis Date  . Mixed hyperlipidemia   . Sleep disturbance, unspecified   . DM (diabetes mellitus)   . Pneumonia   . Bronchitis    Past Surgical History  Procedure Laterality Date  . Laparoscopic cholecystectomy    . Vesicovaginal fistula closure w/ tah    . Right shoulder surgery     Family History  Problem Relation Age of Onset  . Coronary artery disease Father   . Heart attack Sister     x2 prior to age 14   Social History  Substance Use Topics  . Smoking status: Current Every Day Smoker -- 1.00 packs/day    Types: Cigarettes  . Smokeless tobacco: None  . Alcohol Use: No   OB History    No data available      Review of Systems  Constitutional: Positive for diaphoresis. Negative for fever and chills.  HENT: Negative for congestion.   Respiratory: Negative for cough and shortness of breath.   Cardiovascular: Positive for chest pain.  Gastrointestinal: Positive for nausea. Negative for vomiting and diarrhea.  All other systems reviewed and are negative.   Allergies  Review of patient's allergies indicates no known  allergies.  Home Medications   Prior to Admission medications   Medication Sig Start Date End Date Taking? Authorizing Provider  ibuprofen (ADVIL,MOTRIN) 200 MG tablet Take 200 mg by mouth every 6 (six) hours as needed for moderate pain.    Yes Historical Provider, MD  cyclobenzaprine (FLEXERIL) 10 MG tablet Take 1 tablet (10 mg total) by mouth 2 (two) times daily as needed for muscle spasms. 08/25/13   Kaitlyn Szekalski, PA-C  pioglitazone (ACTOS) 15 MG tablet Take 15 mg by mouth 2 (two) times daily.      Historical Provider, MD  predniSONE (DELTASONE) 20 MG tablet Take 2 tablets (40 mg total) by mouth daily. Take 40 mg by mouth daily for 3 days, then 20mg  by mouth daily for 3 days, then 10mg  daily for 3 days 08/25/13   Emilia Beck, PA-C  traMADol (ULTRAM) 50 MG tablet Take 1 tablet (50 mg total) by mouth every 6 (six) hours as needed for pain. 08/25/13   Kaitlyn Szekalski, PA-C   BP 115/66 mmHg  Pulse 78  Temp(Src) 98 F (36.7 C) (Oral)  Resp 16  SpO2 100%   Physical Exam  Constitutional: She is oriented to person, place, and time. She appears well-developed and well-nourished. No distress.  HENT:  Head: Normocephalic and atraumatic.  Eyes: Conjunctivae are normal. Pupils are equal, round, and reactive to light.  Neck: Normal  range of motion. Neck supple.  Cardiovascular: Normal rate, regular rhythm and intact distal pulses.   Pulmonary/Chest: Effort normal and breath sounds normal.  Abdominal: Soft. Bowel sounds are normal. She exhibits no distension. There is no tenderness. There is no rebound.  Musculoskeletal: Normal range of motion.  Neurological: She is alert and oriented to person, place, and time.  Skin: Skin is warm and dry. She is not diaphoretic.    ED Course  Procedures   Labs Review Labs Reviewed  BASIC METABOLIC PANEL - Abnormal; Notable for the following:    Glucose, Bld 187 (*)    All other components within normal limits  CBC - Abnormal; Notable for the  following:    WBC 11.4 (*)    RBC 5.22 (*)    Hemoglobin 15.4 (*)    HCT 47.2 (*)    Platelets 405 (*)    All other components within normal limits  COMPREHENSIVE METABOLIC PANEL - Abnormal; Notable for the following:    Glucose, Bld 109 (*)    All other components within normal limits  GLUCOSE, CAPILLARY - Abnormal; Notable for the following:    Glucose-Capillary 127 (*)    All other components within normal limits  BRAIN NATRIURETIC PEPTIDE  TROPONIN I  TROPONIN I  TROPONIN I  I-STAT TROPOININ, ED   Imaging Review Dg Chest 2 View  07/01/2015   CLINICAL DATA:  Shortness of breath with chest pain extending into left arm for 3 days  EXAM: CHEST  2 VIEW  COMPARISON:  February 01, 2012  FINDINGS: The lungs are clear. Heart size and pulmonary vascularity are normal. No adenopathy. No pneumothorax. No bone lesions.  IMPRESSION: No abnormality noted.   Electronically Signed   By: Bretta Bang III M.D.   On: 07/01/2015 16:05   I have personally reviewed and evaluated these images and lab results as part of my medical decision-making.   EKG Interpretation   Date/Time:  Wednesday July 01 2015 15:08:53 EDT Ventricular Rate:  90 PR Interval:  164 QRS Duration: 84 QT Interval:  368 QTC Calculation: 450 R Axis:   84 Text Interpretation:  Normal sinus rhythm Normal ECG Confirmed by  Rubin Payor  MD, Harrold Donath [phone]) on 07/01/2015 4:55:20 PM      MDM  Ms. Radebaugh is a 31 old female with PMHx of HLD, HTN, DM, and TOB use (0.5 PPD x30 yrs) presenting with CP. Onset at rest. Radiating to left neck and left arm. Worse with exertion. Associated with nausea and diaphoresis. FHx MI at 37.  Exam above notable for middle-age female lying in bed in no acute distress. Afebrile. Heart rate 80s. Not tachypneic. Normotensive. Maintaining saturations on room air without supplemental oxygen. Abdomen benign.  Patient given ASA 324 mg & NO SL x3 with relief in her pain. EKG showing no ST elevation or  depression or T-wave inversions. First troponin undetectable. Labs are normal. Patient is high risk for ACS given history and PMHx and FHx so will need further risk stratification inpatient.  Patient admitted to hospitalist for further evaluation and management of chest pain with concern for ACS. Patient will undergo further risk stratification. Patient understands and agrees with the plan has no further questions concerning this time.  Patient care discussed with and followed by my attending, Dr. Benjiman Core   Final diagnoses:  Chest pain, unspecified chest pain type    Angelina Ok, MD 07/02/15 6045  Benjiman Core, MD 07/02/15 2141

## 2015-07-01 NOTE — H&P (Signed)
[facility] History and Physical  Kristin Livingston MVH:[id number] DOB: Jan 26, 1961 DOA: 07/01/2015  Referring physician: EDP PCP: No primary care provider on file.   Chief Complaint: Chest pain   HPI: Kristin Livingston is a 54 y.o. female with h/o DM, HLD, family history of CAD early onset in 2 older sisters both of whom had MIs in early 70s, one of whom is deceased from CAD.  Patient presents to ED with chest pain.  Symptoms onset Monday, have been gradually worsening since then.  Located in left chest with radiation to left shoulder.  Worse with exertion.  Review of Systems: Systems reviewed.  As above, otherwise negative  Past Medical History  Diagnosis Date  . Mixed hyperlipidemia   . Sleep disturbance, unspecified   . DM (diabetes mellitus)   . Pneumonia   . Bronchitis    Past Surgical History  Procedure Laterality Date  . Laparoscopic cholecystectomy    . Vesicovaginal fistula closure w/ tah    . Right shoulder surgery     Social History:  reports that she has been smoking Cigarettes.  She has been smoking about 1.00 pack per day. She does not have any smokeless tobacco history on file. She reports that she does not drink alcohol or use illicit drugs.  No Known Allergies  Family History  Problem Relation Age of Onset  . Coronary artery disease Father   . Heart attack Sister     x2 prior to age 58     Prior to Admission medications   Medication Sig Start Date End Date Taking? Authorizing Provider  cyclobenzaprine (FLEXERIL) 10 MG tablet Take 1 tablet (10 mg total) by mouth 2 (two) times daily as needed for muscle spasms. 08/25/13   Kaitlyn Szekalski, PA-C  ibuprofen (ADVIL,MOTRIN) 200 MG tablet Take 200 mg by mouth every 6 (six) hours as needed.      Historical Provider, MD  pioglitazone (ACTOS) 15 MG tablet Take 15 mg by mouth 2 (two) times daily.      Historical Provider, MD  predniSONE (DELTASONE) 20 MG tablet Take 2 tablets (40 mg total) by mouth daily. Take 40  mg by mouth daily for 3 days, then  by mouth daily for 3 days, then  daily for 3 days 08/25/13   Emilia Beck, PA-C  traMADol (ULTRAM) 50 MG tablet Take 1 tablet (50 mg total) by mouth every 6 (six) hours as needed for pain. 08/25/13   Emilia Beck, PA-C   Physical Exam: Filed Vitals:   07/01/15 1900  BP: 105/66  Pulse: 79  Temp:   Resp: 17    BP 105/66 mmHg  Pulse 79  Temp(Src) 98 F (36.7 C) (Oral)  Resp 17  SpO2 97%  General Appearance:    Alert, oriented, no distress, appears stated age  Head:    Normocephalic, atraumatic  Eyes:    PERRL, EOMI, sclera non-icteric        Nose:   Nares without drainage or epistaxis. Mucosa, turbinates normal  Throat:   Moist mucous membranes. Oropharynx without erythema or exudate.  Neck:   Supple. No carotid bruits.  No thyromegaly.  No lymphadenopathy.   Back:     No CVA tenderness, no spinal tenderness  Lungs:     Clear to auscultation bilaterally, without wheezes, rhonchi or rales  Chest wall:    No tenderness to palpitation  Heart:    Regular rate and rhythm without murmurs, gallops, rubs  Abdomen:     Soft, non-tender,  nondistended, normal bowel sounds, no organomegaly  Genitalia:    deferred  Rectal:    deferred  Extremities:   No clubbing, cyanosis or edema.  Pulses:   2+ and symmetric all extremities  Skin:   Skin color, texture, turgor normal, no rashes or lesions  Lymph nodes:   Cervical, supraclavicular, and axillary nodes normal  Neurologic:   CNII-XII intact. Normal strength, sensation and reflexes      throughout    Labs on Admission:  Basic Metabolic Panel:  Recent Labs Lab 07/01/15 1513  NA 138  K 4.8  CL 103  CO2 25  GLUCOSE 187*  BUN 9  CREATININE 0.70  CALCIUM 9.3   Liver Function Tests: No results for input(s): AST, ALT, ALKPHOS, BILITOT, PROT, ALBUMIN in the last 168 hours. No results for input(s): LIPASE, AMYLASE in the last 168 hours. No results for input(s): AMMONIA in the last 168  hours. CBC:  Recent Labs Lab 07/01/15 1513  WBC 11.4*  HGB 15.4*  HCT 47.2*  MCV 90.4  PLT 405*   Cardiac Enzymes: No results for input(s): CKTOTAL, CKMB, CKMBINDEX, TROPONINI in the last 168 hours.  BNP (last 3 results) No results for input(s): PROBNP in the last 8760 hours. CBG: No results for input(s): GLUCAP in the last 168 hours.  Radiological Exams on Admission: Dg Chest 2 View  07/01/2015   CLINICAL DATA:  Shortness of breath with chest pain extending into left arm for 3 days  EXAM: CHEST  2 VIEW  COMPARISON:  February 01, 2012  FINDINGS: The lungs are clear. Heart size and pulmonary vascularity are normal. No adenopathy. No pneumothorax. No bone lesions.  IMPRESSION: No abnormality noted.   Electronically Signed   By: Bretta Bang III M.D.   On: 07/01/2015 16:05    EKG: Independently reviewed.  Assessment/Plan Principal Problem:   Chest pain with moderate risk for cardiac etiology Active Problems:   DM2 (diabetes mellitus, type 2)   HYPERLIPIDEMIA, MIXED   1. Chest pain with moderate risk - HEART score is 5 1. Chest pain obs pathway 2. Serial trops 3. Tele monitor 4. NPO after midnight 5. Call cards in AM for stress test 2. DM2 - 1. Low dose SSI Q4H while NPO    Code Status: Full  Family Communication: No family in room Disposition Plan: Admit to obs   Time spent: 50 min  Quintez Maselli M. [facility] Pager [phone]  If 7AM-7PM, please contact the day team taking care of the patient [email or web address] Password [email or web address] 07/01/2015, 7:23 PM

## 2015-07-01 NOTE — ED Notes (Signed)
Pt here with chest pain radiating into her shoulder that started Monday and has progressively gotten worse.

## 2015-07-01 NOTE — Progress Notes (Addendum)
Report received. Pt arrived to unit. VSS. Pt complain of chest pain 5/10 radiating down left arm. EKG obtained, VS taken, pain relieved by SL Nitro X3. MD paged, new orders given. Will continue to monitor closely.   2314. Pt complained of 3/10 chest pain relieved by  of morphine. Vitals taken. MD notified and no new orders given. Will continue to monitor.  Kristin Livingston

## 2015-07-02 ENCOUNTER — Observation Stay (HOSPITAL_COMMUNITY): Payer: BLUE CROSS/BLUE SHIELD

## 2015-07-02 ENCOUNTER — Encounter (HOSPITAL_COMMUNITY): Payer: Self-pay

## 2015-07-02 DIAGNOSIS — E1165 Type 2 diabetes mellitus with hyperglycemia: Secondary | ICD-10-CM | POA: Diagnosis not present

## 2015-07-02 DIAGNOSIS — Z720 Tobacco use: Secondary | ICD-10-CM

## 2015-07-02 DIAGNOSIS — R079 Chest pain, unspecified: Secondary | ICD-10-CM | POA: Diagnosis not present

## 2015-07-02 DIAGNOSIS — K219 Gastro-esophageal reflux disease without esophagitis: Secondary | ICD-10-CM

## 2015-07-02 DIAGNOSIS — M25512 Pain in left shoulder: Secondary | ICD-10-CM

## 2015-07-02 DIAGNOSIS — E782 Mixed hyperlipidemia: Secondary | ICD-10-CM | POA: Diagnosis not present

## 2015-07-02 LAB — GLUCOSE, CAPILLARY
GLUCOSE-CAPILLARY: 141 mg/dL — AB (ref 65–99)
GLUCOSE-CAPILLARY: 202 mg/dL — AB (ref 65–99)
GLUCOSE-CAPILLARY: 224 mg/dL — AB (ref 65–99)
Glucose-Capillary: 200 mg/dL — ABNORMAL HIGH (ref 65–99)

## 2015-07-02 LAB — TROPONIN I: Troponin I: <0.03 ng/mL (ref <0.031)

## 2015-07-02 MED ORDER — NICOTINE 21 MG/24HR TD PT24: 21.0000 mg | MEDICATED_PATCH | Freq: Every day | TRANSDERMAL | Status: DC

## 2015-07-02 MED ORDER — REGADENOSON 0.4 MG/5ML IV SOLN
INTRAVENOUS | Status: AC
  Administered 2015-07-02: 0.4 mg via INTRAVENOUS
  Filled 2015-07-02: qty 5

## 2015-07-02 MED ORDER — METHOCARBAMOL 500 MG PO TABS: 500.0000 mg | ORAL_TABLET | Freq: Three times a day (TID) | ORAL | Status: DC | PRN

## 2015-07-02 MED ORDER — PANTOPRAZOLE SODIUM 40 MG PO TBEC: 40.0000 mg | DELAYED_RELEASE_TABLET | Freq: Every day | ORAL | Status: DC

## 2015-07-02 MED ORDER — TECHNETIUM TC 99M SESTAMIBI GENERIC - CARDIOLITE
30.0000 | Freq: Once | INTRAVENOUS | Status: AC | PRN
  Administered 2015-07-02: 30 via INTRAVENOUS

## 2015-07-02 MED ORDER — ACETAMINOPHEN 325 MG PO TABS: 650.0000 mg | ORAL_TABLET | Freq: Three times a day (TID) | ORAL | Status: DC

## 2015-07-02 MED ORDER — REGADENOSON 0.4 MG/5ML IV SOLN
0.4000 mg | Freq: Once | INTRAVENOUS | Status: AC
  Administered 2015-07-02: 0.4 mg via INTRAVENOUS
  Filled 2015-07-02: qty 5

## 2015-07-02 MED ORDER — TECHNETIUM TC 99M SESTAMIBI GENERIC - CARDIOLITE
10.0000 | Freq: Once | INTRAVENOUS | Status: AC | PRN
  Administered 2015-07-02: 10 via INTRAVENOUS

## 2015-07-02 NOTE — Progress Notes (Addendum)
Lexiscan Myoview done. Tolerated well. Images pending.  Corine Shelter PA-C 07/02/2015 10:19 AM

## 2015-07-02 NOTE — Discharge Summary (Signed)
Physician Discharge Summary  Kristin Livingston AVW:[id number] DOB: 1960/12/28 DOA: 07/01/2015  PCP: No primary care provider on file.  Admit date: 07/01/2015 Discharge date: 07/02/2015  Time spent: 30 minutes  Recommendations for Outpatient Follow-up:  1. Please follow patient left shoulder pain and if needed referred for orthopedic/sports medicine evaluation 2. Close follow up to her diabetes and adjustments to be done as needed to hypoglycemic regimen 3. Check BMET to follow electrolytes and renal function 4. Assist/help patient with tobacco cessation   Discharge Diagnoses:  Principal Problem:   Chest pain with moderate risk for cardiac etiology Active Problems:   DM2 (diabetes mellitus, type 2)   HYPERLIPIDEMIA, MIXED   Tobacco abuse   Esophageal reflux   Discharge Condition: stable and improved. Patient would be discharge home with instructions to follow with PCP in 10 days   Diet recommendation: low carbohydrates and heart healthy diet  There were no vitals filed for this visit.  History of present illness:  54 y.o. female with h/o DM, HLD, family history of CAD early onset in 2 older sisters both of whom had MIs in early 45s, one of whom is deceased from CAD. Patient presents to ED with chest pain. Symptoms onset Monday, have been gradually worsening since then. Located in left chest with radiation to left shoulder. Worse with exertion.  Hospital Course:  1-chest pain: non cardiac -neg troponin X3 -no ischemic changes on EKG or telemetry -low risk probability Myoview stress test -patient would be discharge on PPI -continue heart healthy diet and statins -encourage to quit smoking  2-COPD: stable and compensated -no wheezing and good air movement -will continue home medication regimen  3-HLD: will continue statins  4-tobacco abuse: cessation counseling provided  -will discharge with prescription for nicoderm  5-DM type 2:  -continue outpatient hypoglycemic  regimen -advise to follow low carb diet  6- Anxiety: continue PRN xanax   Procedures:  Myoview stress test: low risk probability; no reversed ischemia appreciated.  Consultations:  Cardiology for stress test  Discharge Exam: Filed Vitals:   07/02/15 1130  BP: 132/72  Pulse: 89  Temp: 97.9 F (36.6 C)  Resp:     General: afebrile, denies SOB and reported no further CP. Complaining of left shoulder pain. Cardiovascular: S1 and S2, no rubs, no gallops Respiratory: good air movement, no wheezing, no crackles Abd: soft, NT, ND, positive BS Extremities: no edema or cyanosis  Discharge Instructions   Discharge Instructions    Diet - low sodium heart healthy    Complete by:  As directed      Discharge instructions    Complete by:  As directed   Follow a heart healthy diet Take medications as prescribed Use robaxin and tylenol as instructed for shoulder pain Arrange follow up with PCP in 10 days Stop smoking Follow heart healthy diet     Increase activity slowly    Complete by:  As directed           Current Discharge Medication List    START taking these medications   Details  acetaminophen (TYLENOL) 325 MG tablet Take 2 tablets (650 mg total) by mouth 3 (three) times daily. To be take for 5 days    methocarbamol (ROBAXIN) 500 MG tablet Take 1 tablet (500 mg total) by mouth every 8 (eight) hours as needed for muscle spasms. Qty: 20 tablet, Refills: 0    nicotine (NICODERM CQ - DOSED IN MG/24 HOURS) 21 mg/24hr patch Place 1 patch (21 mg total)  onto the skin daily. Qty: 28 patch, Refills: 0    pantoprazole (PROTONIX) 40 MG tablet Take 1 tablet (40 mg total) by mouth daily. Qty: 30 tablet, Refills: 1      CONTINUE these medications which have NOT CHANGED   Details  albuterol (PROAIR HFA) 108 (90 BASE) MCG/ACT inhaler Inhale 2 puffs into the lungs every 4 (four) hours as needed for wheezing or shortness of breath.     ALPRAZolam (XANAX) 0.25 MG tablet Take  0.25 mg by mouth 3 (three) times daily as needed. for anxiety Refills: 1    atorvastatin (LIPITOR) 10 MG tablet Take 10 mg by mouth daily.    canagliflozin (INVOKANA) 100 MG TABS tablet Take 200 mg by mouth daily.    glipiZIDE (GLUCOTROL) 10 MG tablet Take 10 mg by mouth 2 (two) times daily.    pioglitazone (ACTOS) 15 MG tablet Take 15 mg by mouth 2 (two) times daily.      sitaGLIPtin (JANUVIA) 100 MG tablet Take 100 mg by mouth daily.    tiZANidine (ZANAFLEX) 4 MG tablet Take 4 mg by mouth 3 (three) times daily.    traMADol (ULTRAM) 50 MG tablet Take 1 tablet (50 mg total) by mouth every 6 (six) hours as needed for pain. Qty: 15 tablet, Refills: 0    vitamin C (ASCORBIC ACID) 250 MG tablet Take 250 mg by mouth daily.    fluticasone (FLONASE) 50 MCG/ACT nasal spray Place 1 spray into the nose daily as needed for allergies.     Melatonin 5 MG CAPS Take 15 mg by mouth at bedtime.    tiotropium (SPIRIVA HANDIHALER) 18 MCG inhalation capsule Place 18 mcg into inhaler and inhale daily as needed (for wheezing or shortness of breath).       STOP taking these medications     ibuprofen (ADVIL,MOTRIN) 200 MG tablet      cyclobenzaprine (FLEXERIL) 10 MG tablet      predniSONE (DELTASONE) 20 MG tablet        No Known Allergies    The results of significant diagnostics from this hospitalization (including imaging, microbiology, ancillary and laboratory) are listed below for reference.    Significant Diagnostic Studies: Dg Chest 2 View  07/01/2015   CLINICAL DATA:  Shortness of breath with chest pain extending into left arm for 3 days  EXAM: CHEST  2 VIEW  COMPARISON:  February 01, 2012  FINDINGS: The lungs are clear. Heart size and pulmonary vascularity are normal. No adenopathy. No pneumothorax. No bone lesions.  IMPRESSION: No abnormality noted.   Electronically Signed   By: Bretta Bang III M.D.   On: 07/01/2015 16:05   Nm Myocar Multi W/spect W/wall Motion / Ef  07/02/2015    CLINICAL DATA:  Chest pain and shoulder pain starting on Monday. Diabetes.  EXAM: MYOCARDIAL IMAGING WITH SPECT (REST AND PHARMACOLOGIC-STRESS)  GATED LEFT VENTRICULAR WALL MOTION STUDY  LEFT VENTRICULAR EJECTION FRACTION  TECHNIQUE: Standard myocardial SPECT imaging was performed after resting intravenous injection of 10 mCi Tc-55m sestamibi. Subsequently, intravenous infusion of Lexiscan was performed under the supervision of the Cardiology staff. At peak effect of the drug, 30 mCi Tc-33m sestamibi was injected intravenously and standard myocardial SPECT imaging was performed. Quantitative gated imaging was also performed to evaluate left ventricular wall motion, and estimate left ventricular ejection fraction.  COMPARISON:  07/01/2015  FINDINGS: Perfusion: No decreased activity in the left ventricle on stress imaging to suggest reversible ischemia or infarction.  Wall Motion: Normal left  ventricular wall motion. No left ventricular dilation.  Left Ventricular Ejection Fraction: Eighty-eight %  End diastolic volume 40 ml  End systolic volume 5 ml  IMPRESSION: 1. No reversible ischemia or infarction.  2. Normal left ventricular wall motion.  3. Left ventricular ejection fraction 88%. This is likely overestimated due to low end-systolic counting statistics.  4. Low-risk stress test findings*.  *2012 Appropriate Use Criteria for Coronary Revascularization Focused Update: J Am Coll Cardiol. 2012;59(9):857-881. http://content.dementiazones.com.aspx?articleid=1201161   Electronically Signed   By: Gaylyn Rong M.D.   On: 07/02/2015 17:01   Labs: Basic Metabolic Panel:  Recent Labs Lab 07/01/15 1513 07/01/15 1824  NA 138 138  K 4.8 3.8  CL 103 107  CO2 25 22  GLUCOSE 187* 109*  BUN 9 9  CREATININE 0.70 0.60  CALCIUM 9.3 9.2   Liver Function Tests:  Recent Labs Lab 07/01/15 1824  AST 19  ALT 16  ALKPHOS 102  BILITOT 0.4  PROT 7.8  ALBUMIN 4.0   CBC:  Recent Labs Lab  07/01/15 1513  WBC 11.4*  HGB 15.4*  HCT 47.2*  MCV 90.4  PLT 405*   Cardiac Enzymes:  Recent Labs Lab 07/01/15 1943 07/01/15 2301 07/02/15 0108  TROPONINI <0.03 <0.03 <0.03   BNP: BNP (last 3 results)  Recent Labs  07/01/15 1824  BNP 10.9   CBG:  Recent Labs Lab 07/01/15 2010 07/02/15 0032 07/02/15 0441 07/02/15 1123 07/02/15 1622  GLUCAP 127* 200* 141* 202* 224*     Signed:  Vassie Loll  [facility] 07/02/2015, 5:16 PM

## 2015-07-02 NOTE — Discharge Instructions (Signed)

## 2015-07-07 ENCOUNTER — Encounter: Payer: Self-pay | Admitting: Cardiovascular Disease

## 2015-07-07 ENCOUNTER — Ambulatory Visit (INDEPENDENT_AMBULATORY_CARE_PROVIDER_SITE_OTHER): Payer: BLUE CROSS/BLUE SHIELD | Admitting: Cardiovascular Disease

## 2015-07-07 VITALS — BP 116/62 | HR 88 | Ht 60.0 in | Wt 122.0 lb

## 2015-07-07 DIAGNOSIS — R079 Chest pain, unspecified: Secondary | ICD-10-CM | POA: Diagnosis not present

## 2015-07-07 DIAGNOSIS — E782 Mixed hyperlipidemia: Secondary | ICD-10-CM | POA: Diagnosis not present

## 2015-07-07 DIAGNOSIS — Z720 Tobacco use: Secondary | ICD-10-CM

## 2015-07-07 MED ORDER — ASPIRIN EC 81 MG PO TBEC: 81.0000 mg | DELAYED_RELEASE_TABLET | Freq: Every day | ORAL | Status: DC

## 2015-07-07 NOTE — Patient Instructions (Signed)
Medication Instructions:  Your physician has recommended you make the following change in your medication:  1. START Aspirin  take one by mouth daily  Labwork: No new orders.   Testing/Procedures: No new orders.   Follow-Up: Your physician recommends that you schedule a follow-up appointment as needed with Dr Kirke Corin.    Any Other Special Instructions Will Be Listed Below (If Applicable).

## 2015-07-11 ENCOUNTER — Encounter: Payer: Self-pay | Admitting: Cardiovascular Disease

## 2015-07-11 NOTE — Assessment & Plan Note (Signed)
Continue treatment with atorvastatin with a target LDL of less than 100 and preferably less than 70 given that she is diabetic.

## 2015-07-11 NOTE — Assessment & Plan Note (Signed)
Smoking cessation is strongly advised that she is currently using a nicotine patch.

## 2015-07-11 NOTE — Progress Notes (Signed)
HPI  This is a 54 year old female who was referred for evaluation of chest pain. She has multiple chronic medical conditions that include type 2 diabetes, tobacco use and hyperlipidemia. She has family history of coronary artery disease. She was hospitalized recently at [facility] for left shoulder pain with radiation to the left upper chest area. The pain is worse with shoulder movement and not with regular activities. Troponin was negative 3. Nuclear stress test showed no evidence of ischemia with normal ejection fraction. She has been doing reasonably well with no substernal tightness. She has mild exertional dyspnea.  No Known Allergies   Current Outpatient Prescriptions on File Prior to Visit  Medication Sig Dispense Refill  . albuterol (PROAIR HFA) 108 (90 BASE) MCG/ACT inhaler Inhale 2 puffs into the lungs every 4 (four) hours as needed for wheezing or shortness of breath.     . ALPRAZolam (XANAX) 0.25 MG tablet Take 0.25 mg by mouth 3 (three) times daily as needed. for anxiety  1  . atorvastatin (LIPITOR) 10 MG tablet Take 10 mg by mouth daily.    . canagliflozin (INVOKANA) 100 MG TABS tablet Take 200 mg by mouth daily.    . fluticasone (FLONASE) 50 MCG/ACT nasal spray Place 1 spray into the nose daily as needed for allergies.     Marland Kitchen glipiZIDE (GLUCOTROL) 10 MG tablet Take 10 mg by mouth 2 (two) times daily.    . Melatonin 5 MG CAPS Take 15 mg by mouth at bedtime.    . nicotine (NICODERM CQ - DOSED IN MG/24 HOURS) 21 mg/24hr patch Place 1 patch (21 mg total) onto the skin daily. 28 patch 0  . pioglitazone (ACTOS) 15 MG tablet Take 15 mg by mouth 2 (two) times daily.      . sitaGLIPtin (JANUVIA) 100 MG tablet Take 100 mg by mouth daily.    Marland Kitchen tiZANidine (ZANAFLEX) 4 MG tablet Take 4 mg by mouth 3 (three) times daily.    . traMADol (ULTRAM) 50 MG tablet Take 1 tablet (50 mg total) by mouth every 6 (six) hours as needed for pain. 15 tablet 0  . vitamin C (ASCORBIC ACID) 250 MG tablet Take 250  mg by mouth daily.     No current facility-administered medications on file prior to visit.     Past Medical History  Diagnosis Date  . Mixed hyperlipidemia   . Sleep disturbance, unspecified   . DM (diabetes mellitus)   . Pneumonia   . Bronchitis      Past Surgical History  Procedure Laterality Date  . Laparoscopic cholecystectomy    . Vesicovaginal fistula closure w/ tah    . Right shoulder surgery    . Abdominal hysterectomy       Family History  Problem Relation Age of Onset  . Coronary artery disease Father   . Heart attack Sister     x2 prior to age 36     Social History   Social History  . Marital Status: Married    Spouse Name: N/A  . Number of Children: N/A  . Years of Education: N/A   Occupational History  . Full Time    Social History Main Topics  . Smoking status: Current Every Day Smoker -- 0.50 packs/day for 35 years    Types: Cigarettes  . Smokeless tobacco: Not on file  . Alcohol Use: No  . Drug Use: No     Comment: rarely  . Sexual Activity: Yes    Birth Control/ Protection:  None   Other Topics Concern  . Not on file   Social History Narrative     ROS A 10 point review of system was performed. It is negative other than that mentioned in the history of present illness.   PHYSICAL EXAM   BP 116/62 mmHg  Pulse 88  Ht 5' (1.524 m)  Wt 122 lb (55.339 kg)  BMI 23.83 kg/m2 Constitutional: She is oriented to person, place, and time. She appears well-developed and well-nourished. No distress.  HENT: No nasal discharge.  Head: Normocephalic and atraumatic.  Eyes: Pupils are equal and round. No discharge.  Neck: Normal range of motion. Neck supple. No JVD present. No thyromegaly present.  Cardiovascular: Normal rate, regular rhythm, normal heart sounds. Exam reveals no gallop and no friction rub. No murmur heard.  Pulmonary/Chest: Effort normal and breath sounds normal. No stridor. No respiratory distress. She has no wheezes. She  has no rales. She exhibits no tenderness.  Abdominal: Soft. Bowel sounds are normal. She exhibits no distension. There is no tenderness. There is no rebound and no guarding.  Musculoskeletal: Normal range of motion. She exhibits no edema and no tenderness.  Neurological: She is alert and oriented to person, place, and time. Coordination normal.  Skin: Skin is warm and dry. No rash noted. She is not diaphoretic. No erythema. No pallor.  Psychiatric: She has a normal mood and affect. Her behavior is normal. Judgment and thought content normal.     EKG: Normal sinus rhythm with no significant ST or T wave changes.   ASSESSMENT AND PLAN

## 2015-07-11 NOTE — Assessment & Plan Note (Signed)
The chest pain is atypical and likely related to her shoulder pain and seems to be musculoskeletal. Recent cardiac workup has been negative including a nuclear stress test. ECG is normal. I do not hear any cardiac murmurs by physical exam. I reassured the patient. Obviously, negative stress test does not rule out atherosclerosis and she has multiple risk factors for coronary artery disease. Thus, I advised her to control her risk factors as much as possible. I recommended that she takes low-dose aspirin 81 mg once daily. She can follow-up with [place] as needed.

## 2015-07-21 ENCOUNTER — Other Ambulatory Visit: Payer: Self-pay | Admitting: Physical Medicine and Rehabilitation

## 2015-07-21 DIAGNOSIS — M542 Cervicalgia: Secondary | ICD-10-CM

## 2015-08-04 ENCOUNTER — Ambulatory Visit
Admission: RE | Admit: 2015-08-04 | Discharge: 2015-08-04 | Disposition: A | Discharge status: Home / Self Care | Payer: BLUE CROSS/BLUE SHIELD | Source: Ambulatory Visit | Attending: Physical Medicine and Rehabilitation | Admitting: Physical Medicine and Rehabilitation

## 2015-08-04 DIAGNOSIS — M542 Cervicalgia: Secondary | ICD-10-CM

## 2017-09-28 ENCOUNTER — Ambulatory Visit (INDEPENDENT_AMBULATORY_CARE_PROVIDER_SITE_OTHER): Payer: BLUE CROSS/BLUE SHIELD | Admitting: Otolaryngology

## 2017-09-28 DIAGNOSIS — J342 Deviated nasal septum: Secondary | ICD-10-CM | POA: Diagnosis not present

## 2017-09-28 DIAGNOSIS — J330 Polyp of nasal cavity: Secondary | ICD-10-CM

## 2017-09-28 DIAGNOSIS — J343 Hypertrophy of nasal turbinates: Secondary | ICD-10-CM

## 2017-10-06 ENCOUNTER — Other Ambulatory Visit (INDEPENDENT_AMBULATORY_CARE_PROVIDER_SITE_OTHER): Payer: Self-pay | Admitting: Otolaryngology

## 2017-10-06 DIAGNOSIS — J329 Chronic sinusitis, unspecified: Secondary | ICD-10-CM

## 2017-10-09 ENCOUNTER — Ambulatory Visit
Admission: RE | Admit: 2017-10-09 | Discharge: 2017-10-09 | Disposition: A | Discharge status: Home / Self Care | Payer: BLUE CROSS/BLUE SHIELD | Source: Ambulatory Visit | Attending: Otolaryngology | Admitting: Otolaryngology

## 2017-10-09 DIAGNOSIS — J329 Chronic sinusitis, unspecified: Secondary | ICD-10-CM

## 2017-10-23 ENCOUNTER — Other Ambulatory Visit: Payer: Self-pay | Admitting: Otolaryngology

## 2017-10-24 DIAGNOSIS — J343 Hypertrophy of nasal turbinates: Secondary | ICD-10-CM

## 2017-10-24 DIAGNOSIS — J322 Chronic ethmoidal sinusitis: Secondary | ICD-10-CM

## 2017-10-24 HISTORY — DX: Chronic ethmoidal sinusitis: J32.2

## 2017-10-24 HISTORY — DX: Hypertrophy of nasal turbinates: J34.3

## 2017-11-13 ENCOUNTER — Encounter (HOSPITAL_BASED_OUTPATIENT_CLINIC_OR_DEPARTMENT_OTHER): Payer: Self-pay | Admitting: *Deleted

## 2017-11-13 ENCOUNTER — Other Ambulatory Visit: Payer: Self-pay

## 2017-11-14 ENCOUNTER — Encounter (HOSPITAL_BASED_OUTPATIENT_CLINIC_OR_DEPARTMENT_OTHER): Payer: Self-pay | Admitting: *Deleted

## 2017-11-14 NOTE — Pre-Procedure Instructions (Signed)
To come for BMET and EKG 

## 2017-11-16 ENCOUNTER — Encounter (HOSPITAL_BASED_OUTPATIENT_CLINIC_OR_DEPARTMENT_OTHER)
Admission: RE | Admit: 2017-11-16 | Discharge: 2017-11-16 | Disposition: A | Discharge status: Home / Self Care | Payer: BLUE CROSS/BLUE SHIELD | Source: Ambulatory Visit | Attending: Otolaryngology | Admitting: Otolaryngology

## 2017-11-16 DIAGNOSIS — E1165 Type 2 diabetes mellitus with hyperglycemia: Secondary | ICD-10-CM | POA: Diagnosis not present

## 2017-11-16 DIAGNOSIS — K219 Gastro-esophageal reflux disease without esophagitis: Secondary | ICD-10-CM | POA: Diagnosis not present

## 2017-11-16 DIAGNOSIS — Z7984 Long term (current) use of oral hypoglycemic drugs: Secondary | ICD-10-CM | POA: Insufficient documentation

## 2017-11-16 DIAGNOSIS — Z79899 Other long term (current) drug therapy: Secondary | ICD-10-CM | POA: Insufficient documentation

## 2017-11-16 DIAGNOSIS — Z01810 Encounter for preprocedural cardiovascular examination: Secondary | ICD-10-CM | POA: Insufficient documentation

## 2017-11-16 DIAGNOSIS — Z01812 Encounter for preprocedural laboratory examination: Secondary | ICD-10-CM | POA: Insufficient documentation

## 2017-11-16 LAB — BASIC METABOLIC PANEL
ANION GAP: 11 (ref 5–15)
BUN: 12 mg/dL (ref 6–20)
CALCIUM: 9 mg/dL (ref 8.9–10.3)
CO2: 24 mmol/L (ref 22–32)
Chloride: 101 mmol/L (ref 101–111)
Creatinine, Ser: 0.69 mg/dL (ref 0.44–1.00)
Glucose, Bld: 169 mg/dL — ABNORMAL HIGH (ref 65–99)
POTASSIUM: 4.9 mmol/L (ref 3.5–5.1)
Sodium: 136 mmol/L (ref 135–145)

## 2017-11-18 NOTE — Anesthesia Preprocedure Evaluation (Addendum)
Anesthesia Evaluation  Patient identified by MRN, date of birth, ID band Patient awake    Reviewed: Allergy & Precautions, H&P , NPO status , Patient's Chart, lab work & pertinent test results  History of Anesthesia Complications (+) history of anesthetic complications  Airway Mallampati: II  TM Distance: >3 FB Neck ROM: Full   Comment: SMALL MOUTH Dental no notable dental hx. (+) Teeth Intact, Poor Dentition, Chipped   Pulmonary neg pulmonary ROS, Current Smoker,    Pulmonary exam normal breath sounds clear to auscultation       Cardiovascular negative cardio ROS Normal cardiovascular exam Rhythm:Regular Rate:Normal     Neuro/Psych negative neurological ROS  negative psych ROS   GI/Hepatic negative GI ROS, Neg liver ROS, GERD  Medicated,  Endo/Other  negative endocrine ROSdiabetes, Poorly Controlled, Type 2, Oral Hypoglycemic Agents  Renal/GU negative Renal ROS  negative genitourinary   Musculoskeletal negative musculoskeletal ROS (+)   Abdominal   Peds negative pediatric ROS (+)  Hematology negative hematology ROS (+)   Anesthesia Other Findings   Reproductive/Obstetrics negative OB ROS                            Anesthesia Physical Anesthesia Plan  ASA: III  Anesthesia Plan: General   Post-op Pain Management:    Induction: Intravenous  PONV Risk Score and Plan: 3 and Ondansetron, Dexamethasone and Treatment may vary due to age or medical condition  Airway Management Planned: Oral ETT  Additional Equipment:   Intra-op Plan:   Post-operative Plan: Extubation in OR  Informed Consent: I have reviewed the patients History and Physical, chart, labs and discussed the procedure including the risks, benefits and alternatives for the proposed anesthesia with the patient or authorized representative who has indicated his/her understanding and acceptance.     Plan Discussed with:  Anesthesiologist and CRNA  Anesthesia Plan Comments: (  )       Anesthesia Quick Evaluation

## 2017-11-20 ENCOUNTER — Other Ambulatory Visit: Payer: Self-pay

## 2017-11-20 ENCOUNTER — Ambulatory Visit (HOSPITAL_BASED_OUTPATIENT_CLINIC_OR_DEPARTMENT_OTHER)
Admission: RE | Admit: 2017-11-20 | Discharge: 2017-11-20 | Disposition: A | Discharge status: Home / Self Care | Payer: BLUE CROSS/BLUE SHIELD | Source: Ambulatory Visit | Attending: Otolaryngology | Admitting: Otolaryngology

## 2017-11-20 ENCOUNTER — Ambulatory Visit (HOSPITAL_BASED_OUTPATIENT_CLINIC_OR_DEPARTMENT_OTHER): Payer: BLUE CROSS/BLUE SHIELD | Admitting: Anesthesiology

## 2017-11-20 ENCOUNTER — Encounter (HOSPITAL_BASED_OUTPATIENT_CLINIC_OR_DEPARTMENT_OTHER): Payer: Self-pay | Admitting: *Deleted

## 2017-11-20 ENCOUNTER — Encounter (HOSPITAL_BASED_OUTPATIENT_CLINIC_OR_DEPARTMENT_OTHER)
Admission: RE | Disposition: A | Discharge status: Home / Self Care | Payer: Self-pay | Source: Ambulatory Visit | Attending: Otolaryngology

## 2017-11-20 DIAGNOSIS — J343 Hypertrophy of nasal turbinates: Secondary | ICD-10-CM | POA: Insufficient documentation

## 2017-11-20 DIAGNOSIS — Z7984 Long term (current) use of oral hypoglycemic drugs: Secondary | ICD-10-CM | POA: Insufficient documentation

## 2017-11-20 DIAGNOSIS — J320 Chronic maxillary sinusitis: Secondary | ICD-10-CM

## 2017-11-20 DIAGNOSIS — E039 Hypothyroidism, unspecified: Secondary | ICD-10-CM | POA: Insufficient documentation

## 2017-11-20 DIAGNOSIS — Z79899 Other long term (current) drug therapy: Secondary | ICD-10-CM | POA: Diagnosis not present

## 2017-11-20 DIAGNOSIS — J322 Chronic ethmoidal sinusitis: Secondary | ICD-10-CM

## 2017-11-20 DIAGNOSIS — J3489 Other specified disorders of nose and nasal sinuses: Secondary | ICD-10-CM | POA: Diagnosis not present

## 2017-11-20 DIAGNOSIS — K219 Gastro-esophageal reflux disease without esophagitis: Secondary | ICD-10-CM | POA: Diagnosis not present

## 2017-11-20 DIAGNOSIS — J338 Other polyp of sinus: Secondary | ICD-10-CM

## 2017-11-20 HISTORY — DX: Presence of dental prosthetic device (complete) (partial): Z97.2

## 2017-11-20 HISTORY — PX: MAXILLARY ANTROSTOMY: SHX2003

## 2017-11-20 HISTORY — PX: TURBINATE RESECTION: SHX6158

## 2017-11-20 HISTORY — PX: SINUS ENDO WITH FUSION: SHX5329

## 2017-11-20 HISTORY — DX: Type 2 diabetes mellitus without complications: E11.9

## 2017-11-20 HISTORY — DX: Headache: R51

## 2017-11-20 HISTORY — DX: Headache, unspecified: R51.9

## 2017-11-20 HISTORY — DX: Elevated white blood cell count, unspecified: D72.829

## 2017-11-20 HISTORY — DX: Chronic ethmoidal sinusitis: J32.2

## 2017-11-20 HISTORY — DX: Barrett's esophagus without dysplasia: K22.70

## 2017-11-20 HISTORY — PX: ETHMOIDECTOMY: SHX5197

## 2017-11-20 HISTORY — DX: Hypertrophy of nasal turbinates: J34.3

## 2017-11-20 HISTORY — DX: Anemia, unspecified: D64.9

## 2017-11-20 HISTORY — DX: Chronic maxillary sinusitis: J32.0

## 2017-11-20 LAB — GLUCOSE, CAPILLARY
Glucose-Capillary: 229 mg/dL — ABNORMAL HIGH (ref 65–99)
Glucose-Capillary: 277 mg/dL — ABNORMAL HIGH (ref 65–99)

## 2017-11-20 SURGERY — TURBINATE RESECTION
Anesthesia: General | Site: Nose | Laterality: Right

## 2017-11-20 MED ORDER — PHENYLEPHRINE HCL 10 MG/ML IJ SOLN
INTRAMUSCULAR | Status: DC | PRN
Start: 2017-11-20 — End: 2017-11-20
  Administered 2017-11-20 (×2): 80 ug via INTRAVENOUS
  Administered 2017-11-20: 200 ug via INTRAVENOUS

## 2017-11-20 MED ORDER — AMOXICILLIN 875 MG PO TABS: 875.0000 mg | ORAL_TABLET | Freq: Two times a day (BID) | ORAL | 0 refills | Status: AC

## 2017-11-20 MED ORDER — FENTANYL CITRATE (PF) 100 MCG/2ML IJ SOLN
INTRAMUSCULAR | Status: AC
  Filled 2017-11-20: qty 2

## 2017-11-20 MED ORDER — MIDAZOLAM HCL 2 MG/2ML IJ SOLN
1.0000 mg | INTRAMUSCULAR | Status: DC | PRN
  Administered 2017-11-20: 2 mg via INTRAVENOUS

## 2017-11-20 MED ORDER — OXYCODONE-ACETAMINOPHEN 5-325 MG PO TABS: 1.0000 | ORAL_TABLET | ORAL | 0 refills | Status: DC | PRN

## 2017-11-20 MED ORDER — LACTATED RINGERS IV SOLN
INTRAVENOUS | Status: DC
  Administered 2017-11-20 (×2): via INTRAVENOUS

## 2017-11-20 MED ORDER — OXYCODONE HCL 5 MG PO TABS
ORAL_TABLET | ORAL | Status: AC
  Filled 2017-11-20: qty 1

## 2017-11-20 MED ORDER — CEFAZOLIN SODIUM-DEXTROSE 2-3 GM-%(50ML) IV SOLR
INTRAVENOUS | Status: DC | PRN
  Administered 2017-11-20: 2 g via INTRAVENOUS

## 2017-11-20 MED ORDER — ACETAMINOPHEN 325 MG PO TABS: 325.0000 mg | ORAL_TABLET | ORAL | Status: DC | PRN

## 2017-11-20 MED ORDER — MUPIROCIN 2 % EX OINT
TOPICAL_OINTMENT | CUTANEOUS | Status: AC
  Filled 2017-11-20: qty 22

## 2017-11-20 MED ORDER — ROCURONIUM BROMIDE 100 MG/10ML IV SOLN
INTRAVENOUS | Status: DC | PRN
  Administered 2017-11-20: 50 mg via INTRAVENOUS

## 2017-11-20 MED ORDER — ROCURONIUM BROMIDE 10 MG/ML (PF) SYRINGE
PREFILLED_SYRINGE | INTRAVENOUS | Status: AC
  Filled 2017-11-20: qty 5

## 2017-11-20 MED ORDER — OXYMETAZOLINE HCL 0.05 % NA SOLN
NASAL | Status: AC
  Filled 2017-11-20: qty 15

## 2017-11-20 MED ORDER — MIDAZOLAM HCL 2 MG/2ML IJ SOLN
INTRAMUSCULAR | Status: AC
  Filled 2017-11-20: qty 2

## 2017-11-20 MED ORDER — GLYCOPYRROLATE 0.2 MG/ML IJ SOLN
INTRAMUSCULAR | Status: DC | PRN
  Administered 2017-11-20: .4 mg via INTRAVENOUS

## 2017-11-20 MED ORDER — OXYCODONE HCL 5 MG PO TABS
5.0000 mg | ORAL_TABLET | Freq: Once | ORAL | Status: AC | PRN
  Administered 2017-11-20: 5 mg via ORAL

## 2017-11-20 MED ORDER — NEOSTIGMINE METHYLSULFATE 10 MG/10ML IV SOLN
INTRAVENOUS | Status: DC | PRN
  Administered 2017-11-20: 2.5 mg via INTRAVENOUS

## 2017-11-20 MED ORDER — SCOPOLAMINE 1 MG/3DAYS TD PT72: 1.0000 | MEDICATED_PATCH | Freq: Once | TRANSDERMAL | Status: DC | PRN

## 2017-11-20 MED ORDER — OXYCODONE HCL 5 MG/5ML PO SOLN: 5.0000 mg | Freq: Once | ORAL | Status: AC | PRN

## 2017-11-20 MED ORDER — FENTANYL CITRATE (PF) 100 MCG/2ML IJ SOLN
25.0000 ug | INTRAMUSCULAR | Status: DC | PRN
Start: 2017-11-20 — End: 2017-11-20

## 2017-11-20 MED ORDER — MEPERIDINE HCL 25 MG/ML IJ SOLN: 6.2500 mg | INTRAMUSCULAR | Status: DC | PRN

## 2017-11-20 MED ORDER — ACETAMINOPHEN 160 MG/5ML PO SOLN: 325.0000 mg | ORAL | Status: DC | PRN

## 2017-11-20 MED ORDER — FENTANYL CITRATE (PF) 100 MCG/2ML IJ SOLN
50.0000 ug | INTRAMUSCULAR | Status: DC | PRN
  Administered 2017-11-20: 100 ug via INTRAVENOUS

## 2017-11-20 MED ORDER — LIDOCAINE-EPINEPHRINE 1 %-1:100000 IJ SOLN
INTRAMUSCULAR | Status: AC
  Filled 2017-11-20: qty 1

## 2017-11-20 MED ORDER — ONDANSETRON HCL 4 MG/2ML IJ SOLN: 4.0000 mg | Freq: Once | INTRAMUSCULAR | Status: DC | PRN

## 2017-11-20 MED ORDER — LIDOCAINE HCL (CARDIAC) 20 MG/ML IV SOLN
INTRAVENOUS | Status: DC | PRN
  Administered 2017-11-20: 60 mg via INTRAVENOUS

## 2017-11-20 MED ORDER — KETOROLAC TROMETHAMINE 30 MG/ML IJ SOLN: 30.0000 mg | Freq: Once | INTRAMUSCULAR | Status: DC | PRN

## 2017-11-20 MED ORDER — DEXAMETHASONE SODIUM PHOSPHATE 4 MG/ML IJ SOLN
INTRAMUSCULAR | Status: DC | PRN
  Administered 2017-11-20: 10 mg via INTRAVENOUS

## 2017-11-20 MED ORDER — PROPOFOL 10 MG/ML IV BOLUS
INTRAVENOUS | Status: AC
  Filled 2017-11-20: qty 20

## 2017-11-20 MED ORDER — LIDOCAINE 2% (20 MG/ML) 5 ML SYRINGE
INTRAMUSCULAR | Status: AC
  Filled 2017-11-20: qty 5

## 2017-11-20 MED ORDER — PROPOFOL 10 MG/ML IV BOLUS
INTRAVENOUS | Status: DC | PRN
  Administered 2017-11-20: 150 mg via INTRAVENOUS

## 2017-11-20 MED ORDER — OXYMETAZOLINE HCL 0.05 % NA SOLN
NASAL | Status: DC | PRN
  Administered 2017-11-20: 1 via TOPICAL

## 2017-11-20 MED ORDER — MUPIROCIN 2 % EX OINT
TOPICAL_OINTMENT | CUTANEOUS | Status: DC | PRN
  Administered 2017-11-20: 1 via TOPICAL

## 2017-11-20 SURGICAL SUPPLY — 51 items
ATTRACTOMAT 16X20 MAGNETIC DRP (DRAPES) IMPLANT
BLADE RAD40 ROTATE 4M 4 5PK (BLADE) IMPLANT
BLADE RAD60 ROTATE M4 4 5PK (BLADE) IMPLANT
BLADE ROTATE RAD 12 4 M4 (BLADE) IMPLANT
BLADE ROTATE RAD 40 4 M4 (BLADE) ×1 IMPLANT
BLADE ROTATE TRICUT 4X13 M4 (BLADE) ×3 IMPLANT
BLADE TRICUT ROTATE M4 4 5PK (BLADE) IMPLANT
BUR HS RAD FRONTAL 3 (BURR) IMPLANT
CANISTER SUC SOCK COL 7IN (MISCELLANEOUS) ×6 IMPLANT
CANISTER SUCT 1200ML W/VALVE (MISCELLANEOUS) ×4 IMPLANT
COAGULATOR SUCT 8FR VV (MISCELLANEOUS) ×3 IMPLANT
COAGULATOR SUCT SWTCH 10FR 6 (ELECTROSURGICAL) IMPLANT
DECANTER SPIKE VIAL GLASS SM (MISCELLANEOUS) ×1 IMPLANT
DRSG NASAL KENNEDY LMNT 8CM (GAUZE/BANDAGES/DRESSINGS) IMPLANT
DRSG NASOPORE 8CM (GAUZE/BANDAGES/DRESSINGS) ×1 IMPLANT
DRSG TELFA 3X8 NADH (GAUZE/BANDAGES/DRESSINGS) IMPLANT
ELECT REM PT RETURN 9FT ADLT (ELECTROSURGICAL) ×3
ELECTRODE REM PT RTRN 9FT ADLT (ELECTROSURGICAL) ×2 IMPLANT
GLOVE BIO SURGEON STRL SZ7.5 (GLOVE) ×3 IMPLANT
GLOVE ECLIPSE 6.5 STRL STRAW (GLOVE) ×1 IMPLANT
GOWN STRL REUS W/ TWL LRG LVL3 (GOWN DISPOSABLE) ×4 IMPLANT
GOWN STRL REUS W/TWL LRG LVL3 (GOWN DISPOSABLE) ×6
HEMOSTAT SURGICEL 2X14 (HEMOSTASIS) IMPLANT
IV NS 1000ML (IV SOLUTION)
IV NS 1000ML BAXH (IV SOLUTION) IMPLANT
IV NS 500ML (IV SOLUTION) ×3
IV NS 500ML BAXH (IV SOLUTION) ×2 IMPLANT
NDL HYPO 25X1 1.5 SAFETY (NEEDLE) ×2 IMPLANT
NDL PRECISIONGLIDE 27X1.5 (NEEDLE) ×2 IMPLANT
NDL SPNL 25GX3.5 QUINCKE BL (NEEDLE) IMPLANT
NEEDLE HYPO 25X1 1.5 SAFETY (NEEDLE) ×3 IMPLANT
NEEDLE PRECISIONGLIDE 27X1.5 (NEEDLE) ×3 IMPLANT
NEEDLE SPNL 25GX3.5 QUINCKE BL (NEEDLE) IMPLANT
NS IRRIG 1000ML POUR BTL (IV SOLUTION) ×1 IMPLANT
PACK BASIN DAY SURGERY FS (CUSTOM PROCEDURE TRAY) ×3 IMPLANT
PACK ENT DAY SURGERY (CUSTOM PROCEDURE TRAY) ×3 IMPLANT
PACKING NASAL EPIS 4X2.4 XEROG (MISCELLANEOUS) IMPLANT
PAD DRESSING TELFA 3X8 NADH (GAUZE/BANDAGES/DRESSINGS) IMPLANT
SLEEVE SCD COMPRESS KNEE MED (MISCELLANEOUS) ×1 IMPLANT
SOLUTION BUTLER CLEAR DIP (MISCELLANEOUS) ×3 IMPLANT
SPLINT NASAL AIRWAY SILICONE (MISCELLANEOUS) IMPLANT
SPONGE GAUZE 2X2 8PLY STRL LF (GAUZE/BANDAGES/DRESSINGS) ×3 IMPLANT
SPONGE NEURO XRAY DETECT 1X3 (DISPOSABLE) ×3 IMPLANT
TOWEL OR 17X24 6PK STRL BLUE (TOWEL DISPOSABLE) ×3 IMPLANT
TRACKER ENT INSTRUMENT (MISCELLANEOUS) ×3 IMPLANT
TRACKER ENT PATIENT (MISCELLANEOUS) ×3 IMPLANT
TUBE CONNECTING 20X1/4 (TUBING) ×3 IMPLANT
TUBE SALEM SUMP 12R W/ARV (TUBING) IMPLANT
TUBE SALEM SUMP 16 FR W/ARV (TUBING) ×1 IMPLANT
TUBING STRAIGHTSHOT EPS 5PK (TUBING) ×3 IMPLANT
YANKAUER SUCT BULB TIP NO VENT (SUCTIONS) ×3 IMPLANT

## 2017-11-20 NOTE — Anesthesia Postprocedure Evaluation (Signed)
Anesthesia Post Note  Patient: Kristin Livingston  Procedure(s) Performed: BILATERAL TURBINATE RESECTION (Bilateral Nose) RIGHT ETHMOIDECTOMY (Right Nose) BILATERAL MAXILLARY ANTROSTOMY (Bilateral Nose) ENDOSCOPIC SINUS SURGERY WITH FUSION NAVIGATION (Bilateral Nose)     Patient location during evaluation: PACU Anesthesia Type: General Level of consciousness: awake and alert Pain management: pain level controlled Vital Signs Assessment: post-procedure vital signs reviewed and stable Respiratory status: spontaneous breathing, nonlabored ventilation, respiratory function stable and patient connected to nasal cannula oxygen Cardiovascular status: blood pressure returned to baseline and stable Postop Assessment: no apparent nausea or vomiting Anesthetic complications: no    Last Vitals:  Vitals:   11/20/17 1115 11/20/17 1130  BP: (!) 149/80 (!) 149/77  Pulse: 97 93  Resp: (!) 24 19  Temp: 36.8 C   SpO2: 99% 98%    Last Pain:  Vitals:   11/20/17 1115  TempSrc:   PainSc: 0-No pain                 Evertte Sones

## 2017-11-20 NOTE — Transfer of Care (Signed)
Immediate Anesthesia Transfer of Care Note  Patient: Kristin Livingston  Procedure(s) Performed: BILATERAL TURBINATE RESECTION (Bilateral Nose) RIGHT ETHMOIDECTOMY (Right Nose) BILATERAL MAXILLARY ANTROSTOMY (Bilateral Nose) ENDOSCOPIC SINUS SURGERY WITH FUSION NAVIGATION (Bilateral Nose)  Patient Location: PACU  Anesthesia Type:General  Level of Consciousness: awake, alert  and oriented  Airway & Oxygen Therapy: Patient Spontanous Breathing and Patient connected to face mask oxygen  Post-op Assessment: Report given to RN and Post -op Vital signs reviewed and stable  Post vital signs: Reviewed and stable  Last Vitals:  Vitals:   11/20/17 0807  BP: 125/70  Pulse: 90  Resp: 18  Temp: 36.4 C  SpO2: 98%    Last Pain:  Vitals:   11/20/17 0807  TempSrc: Oral  PainSc: 0-No pain      Patients Stated Pain Goal: 3 (11/20/17 0807)  Complications: No apparent anesthesia complications

## 2017-11-20 NOTE — Op Note (Signed)
DATE OF PROCEDURE: 11/20/2017  OPERATIVE REPORT   SURGEON: Newman PiesSu Ambika Zettlemoyer, MD   PREOPERATIVE DIAGNOSES:  1. Chronic maxillary and ethmoid sinusitis. 2. Bilateral inferior turbinate hypertrophy.  3. Chronic nasal obstruction.  POSTOPERATIVE DIAGNOSES:  1. Chronic maxillary and ethmoid sinusitis. 2. Bilateral inferior turbinate hypertrophy.  3. Chronic nasal obstruction.  PROCEDURE PERFORMED:  1. Right endoscopic total ethmoidectomy. 2. Bilateral endoscopic maxillary antrostomy with polyp removal. 3. Bilateral partial inferior turbinate resection.  4. FUSION stereotactic image guidance  ANESTHESIA: General endotracheal tube anesthesia.   COMPLICATIONS: None.   ESTIMATED BLOOD LOSS: 100 mL.   INDICATION FOR PROCEDURE: Kristin MastSusan L Livingston is a 57 y.o. female with a history of chronic nasal obstruction and frequent recurrent rhinosinusitis. She previously underwent sinus surgery [place] years ago [place]. After the surgery, she continued to have chronic nasal obstruction and recurrent sinus pressure and pain. She was treated with multiple courses of antibiotics with no relief in her symptoms. She has also noted significant decrease in her sense of smell. Her CT scan showed complete opacification of her right maxillary sinus. The right ethmoid and the left maxillary sinus were also partially opacified. Both ostiomeatal complexes were obstructed. The patient's inferior turbinates were also severely hypertrophied, contributing to her chronic nasal obstruction. More than 95% of her nasal passageways were obstructed. Based on the above findings, the decision was made for the patient to undergo the above-stated procedure. The risks, benefits, alternatives, and details of the procedure were discussed with the patient. Questions were invited and answered. Informed consent was obtained.   DESCRIPTION OF PROCEDURE: The patient was taken to the operating room and placed supine on the operating table. General  endotracheal tube anesthesia was administered by the anesthesiologist. The patient was positioned, and prepped and draped in the standard fashion for nasal surgery. Pledgets soaked with Afrin were placed in both nasal cavities for decongestion. The pledgets were subsequently removed. The FUSION stereotactic image guidance marker was placed. The image guidance system was functional throughout the case.  Attention was first focused on the left nasal cavity. Using a 0 scope, the left nasal cavity was examined. The left inferior turbinate was hypertrophied. The left middle turbinate was carefully medialized. Polypoid tissue was noted to obstruct the left maxillary opening. The polypoid tissue was removed using a combination of microdebrider and Tru-Cut forceps. Polypoid tissue was also removed from the maxillary sinus.  Attention was then focused on the right nasal cavity. The right nasal cavity was completely obstructed with a large nasal polyp. The nasal polyp extending into the nasopharynx. The right middle turbinate was medialized. The polypoid tissue was removed in a piecemeal fashion using a combination of Blakesley forceps and microdebrider. The right maxillary opening was enlarged using a combination of backbiter and Tru-Cut forceps. A large amount of polypoid tissue was removed from the right maxillary sinus. Purulent drainage was also noted from both maxillary and ethmoid sinuses. The bony partitions of the right ethmoid cavities were taken down. Both maxillary and ethmoid sinuses were copiously irrigated.   Attention was then focused on the inferior turbinates. The inferior one half of both hypertrophied inferior turbinate was crossclamped with a Kelly clamp. The inferior one half of each inferior turbinate was then resected with a pair of cross cutting scissors. Hemostasis was achieved with a suction cautery device.   The care of the patient was turned over to the anesthesiologist. The patient was  awakened from anesthesia without difficulty. The patient was extubated and transferred to the  recovery room in good condition.   OPERATIVE FINDINGS: Bilateral chronic rhinosinusitis with polyposis, involving bilateral maxillary sinuses and the right ethmoid sinus.  SPECIMEN: Bilateral sinus contents  FOLLOWUP CARE: The patient be discharged home once she is awake and alert. The patient will be placed on Percocet 1 tablets p.o. q.4 hours p.r.n. pain, and amoxicillin 875 mg p.o. b.i.d. for 5 days. The patient will follow up in my office in approximately 1 week.  Ciclaly Mulcahey Philomena Doheny, MD

## 2017-11-20 NOTE — Anesthesia Procedure Notes (Signed)
Procedure Name: Intubation Performed by: Verita Lamb, CRNA Pre-anesthesia Checklist: Patient identified, Suction available, Emergency Drugs available, Patient being monitored and Timeout performed Patient Re-evaluated:Patient Re-evaluated prior to induction Oxygen Delivery Method: Circle system utilized Preoxygenation: Pre-oxygenation with 100% oxygen Induction Type: IV induction Ventilation: Mask ventilation without difficulty Laryngoscope Size: Mac and 3 Grade View: Grade I Tube type: Oral Number of attempts: 1 Airway Equipment and Method: Stylet Placement Confirmation: ETT inserted through vocal cords under direct vision,  positive ETCO2,  CO2 detector and breath sounds checked- equal and bilateral Secured at: 20 cm Tube secured with: Tape Dental Injury: Teeth and Oropharynx as per pre-operative assessment

## 2017-11-20 NOTE — H&P (Signed)
Cc: Chronic rhinosinusitis, chronic nasal obstruction  HPI: The patient is a 57 year old female who returns today for her follow-up evaluation.  The patient has a history of recurrent rhinosinusitis.  She previously underwent sinus surgery 2 years ago [place].  After the surgery, she continued to have chronic nasal obstruction and recurrent sinus pressure.  She was treated with multiple courses of antibiotics with no relief in her symptoms.  She also complains of significant decrease in her sense of smell.  She underwent a sinus CT scan earlier this month.  The CT showed complete opacification of her right maxillary sinus.  Her right ethmoid and left maxillary sinus were also partially opacified.  Both ostiomeatal complexes were obstructed.  The rest of her sinuses were clear.  The patient also has bilateral severe inferior turbinate hypertrophy. This has resulted in chronic nasal obstruction.  She has not responded to steroid nasal spray so far.    Exam: The nasal cavities were decongested and anesthetised with a combination of oxymetazoline and 4% lidocaine solution.  The flexible scope was inserted into the right nasal cavity.  Endoscopy of the inferior and middle meatus was performed.  Edematous mucosa was noted.  Large polypoid tissue was noted within the right nasal cavity. Olfactory cleft was clear.  Nasopharynx was clear.  Turbinates were hypertrophied but without mass.  Incomplete response to decongestion.  The procedure was repeated on the contralateral side with similar findings.  The patient tolerated the procedure well.  Instructions were given to avoid eating or drinking for 2 hours.    Assessment: 1.  Bilateral chronic rhinosinusitis involving both maxillary sinuses and right ethmoid sinus. Large polypoid tissue was also noted within the right nasal cavity.   2.  Bilateral inferior turbinate hypertrophy, causing greater than 95% obstruction of her nasal passageways bilaterally.     Plan: 1.  The nasal endoscopy findings and the CT images are reviewed with the patient.   2.  The patient should continue with her steroid nasal spray.  She has recently completed her most recent course of antibiotics.  3.  Based on the above findings, the patient will likely benefit from undergoing revision endoscopic sinus surgery and bilateral turbinate reduction.  The risks, benefits, details of the procedures are reviewed.  4.  The patient would like to proceed with the procedures.

## 2017-11-20 NOTE — Discharge Instructions (Addendum)

## 2017-11-21 ENCOUNTER — Encounter (HOSPITAL_BASED_OUTPATIENT_CLINIC_OR_DEPARTMENT_OTHER): Payer: Self-pay | Admitting: Otolaryngology

## 2018-09-11 IMAGING — CT CT MAXILLOFACIAL W/O CM
3 series · 16 of 40 positions shown, 19 images · non-contrast
Comparison: CT sinus 07/02/2007

CLINICAL DATA: Chronic sinusitis

EXAM:
CT MAXILLOFACIAL WITHOUT CONTRAST
TECHNIQUE: Multidetector CT images of the paranasal sinuses were obtained using
the standard protocol without intravenous contrast.

[Series 3: axial soft 1.25 · axial · 0.49mm/px · z∈[-87,-32]mm · 3 of 189 slices shown]
[im 23/189  brain]
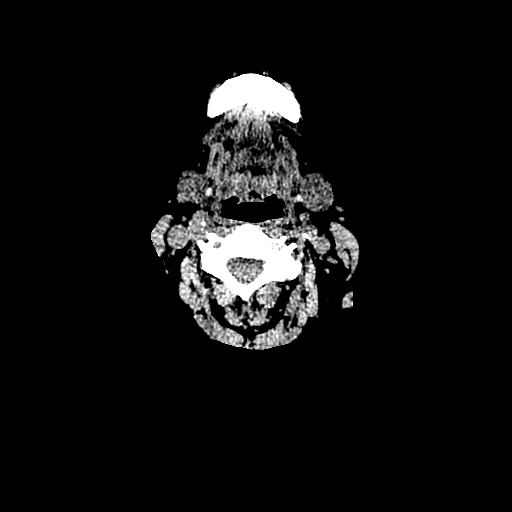
[im 45/189  brain]
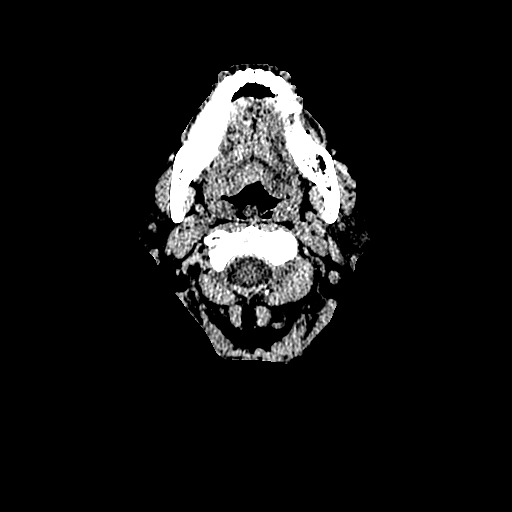
[im 67/189  brain]
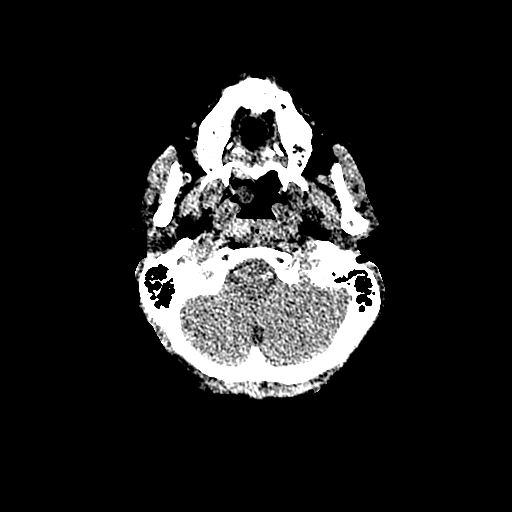

[Series 601: coronal facial · coronal · 0.49mm/px · 3 of 122 slices shown]
[im 41/122  bone]
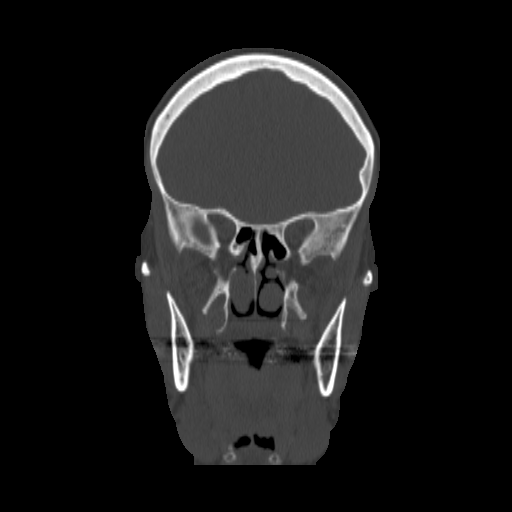
[im 54/122  bone]
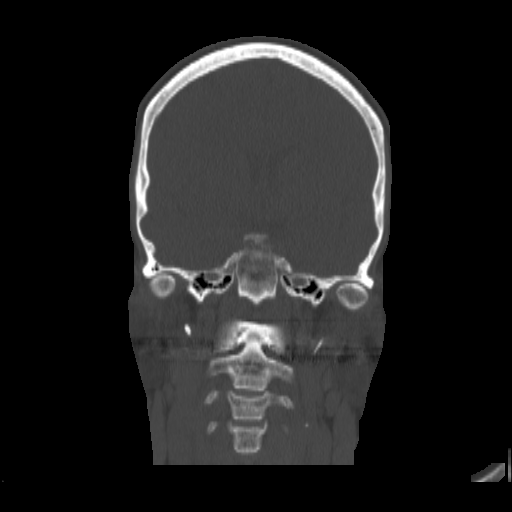
[im 68/122  bone]
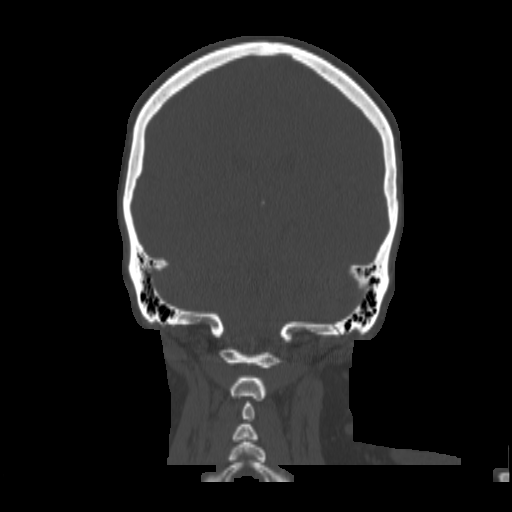

[Series 602: sagittal facial · sagittal · 0.49mm/px · 10 of 126 slices shown, 13 images]
[im 12/126  brain]
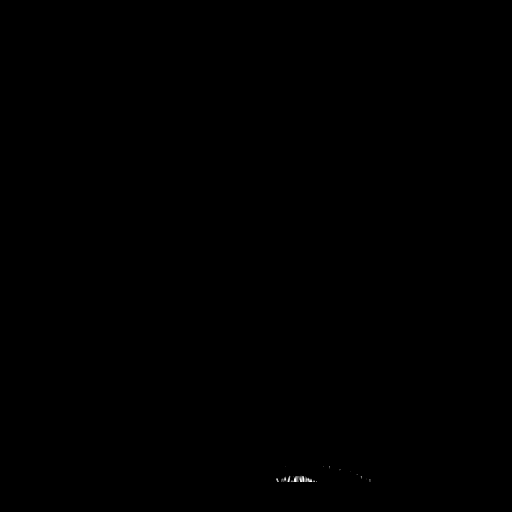
[im 12/126  bone]
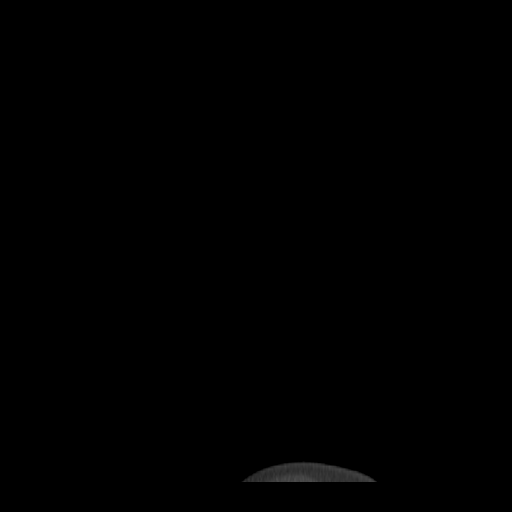
[im 23/126  bone]
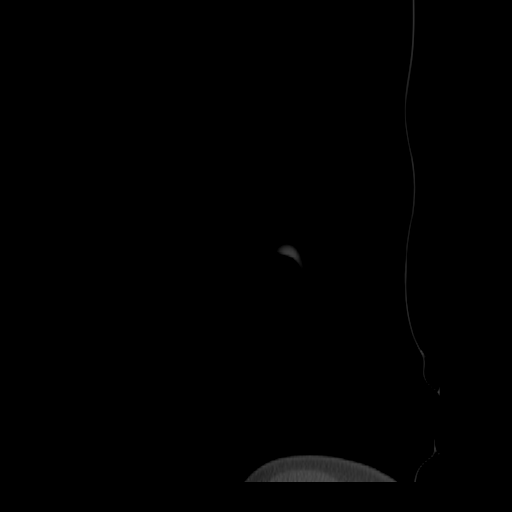
[im 35/126  bone]
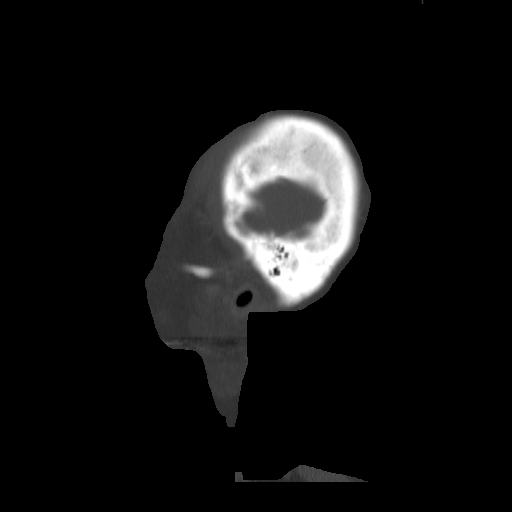
[im 46/126  bone]
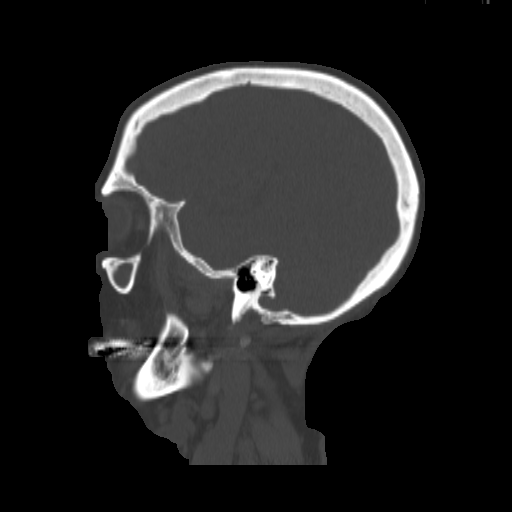
[im 57/126  brain]
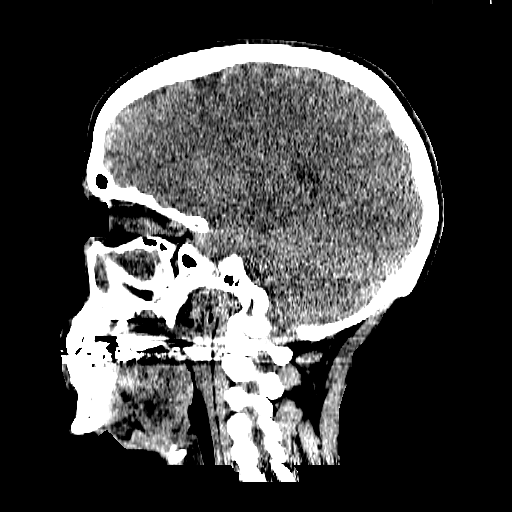
[im 57/126  bone]
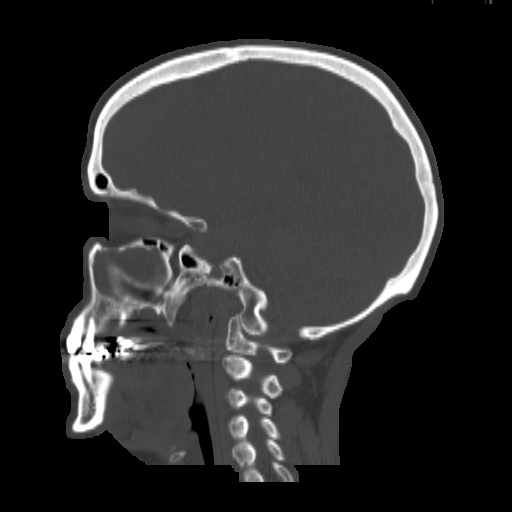
[im 69/126  bone]
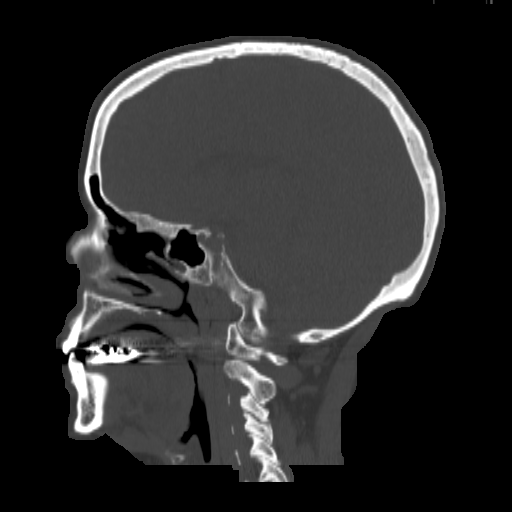
[im 80/126  bone]
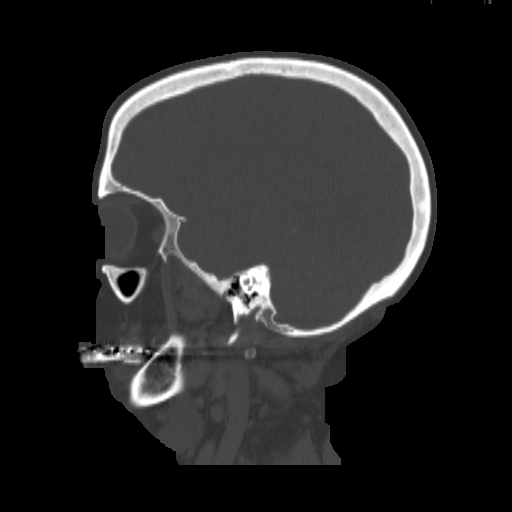
[im 91/126  bone]
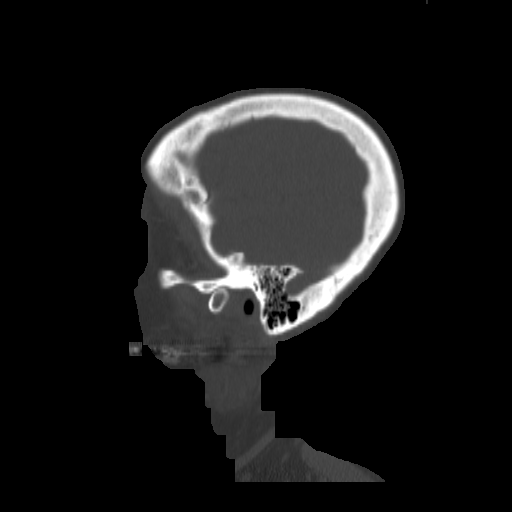
[im 103/126  brain]
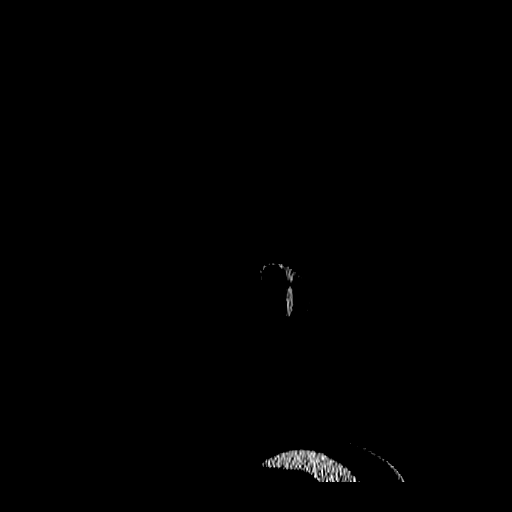
[im 103/126  bone]
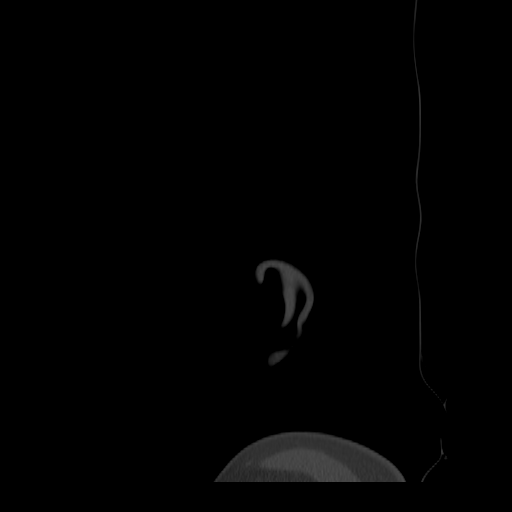
[im 114/126  bone]
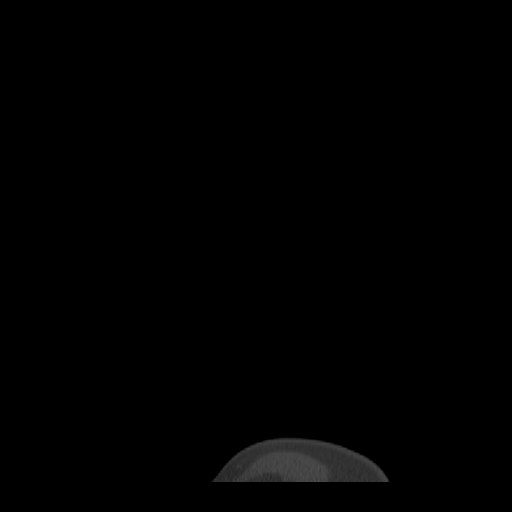

[16 of 40 positions shown; findings below may reference images not displayed]

FINDINGS: Paranasal sinuses:

Frontal: Normally aerated. Patent frontal sinus drainage pathways.

Ethmoid: Soft tissue density in the right ethmoid sinus extending
into the maxillary sinus, probable polyp. This is obstructing the
right maxillary sinus. Partial ethmoidectomy bilaterally. Left
ethmoid sinus clear.

Maxillary: Complete opacification right maxillary sinus. Widening of
the ostiomeatal complex which is occluded. Probable obstructing
polyp. Medial antrostomy on the left. Mild mucosal edema left
maxillary sinus.

Sphenoid: Normally aerated. Patent sphenoethmoidal recesses.

Mastoid:  Clear

Right ostiomeatal unit: Widened ostiomeatal complex filled with soft
tissue density most likely polyp.

Left ostiomeatal unit: Medial antrostomy widely patent. Mild mucosal
edema at the antrostomy.

Nasal passages: Asymmetric edema in the right nasal cavity with
enlargement of the inferior turbinate. Possible nasal polyp on the
right. Intact nasal septum is midline.

Other: Negative soft tissues of the orbit and face. Limited
intracranial imaging negative
IMPRESSION: Soft tissue density fills the right maxillary sinus with widening of
the ostiomeatal complex on the right. Soft tissue density extends
into the ethmoid sinus and nasal cavity on the right, probable polyp
obstructing the right maxillary sinus.

Postop ethmoidectomy bilaterally. Left maxillary antrostomy is
patent

## 2018-11-13 ENCOUNTER — Encounter (INDEPENDENT_AMBULATORY_CARE_PROVIDER_SITE_OTHER): Payer: Self-pay | Admitting: Orthopaedic Surgery

## 2018-11-13 ENCOUNTER — Ambulatory Visit (INDEPENDENT_AMBULATORY_CARE_PROVIDER_SITE_OTHER): Payer: BLUE CROSS/BLUE SHIELD | Admitting: Orthopaedic Surgery

## 2018-11-13 DIAGNOSIS — M65352 Trigger finger, left little finger: Secondary | ICD-10-CM

## 2018-11-13 DIAGNOSIS — M65331 Trigger finger, right middle finger: Secondary | ICD-10-CM

## 2018-11-13 DIAGNOSIS — M65332 Trigger finger, left middle finger: Secondary | ICD-10-CM

## 2018-11-13 MED ORDER — METHYLPREDNISOLONE ACETATE 40 MG/ML IJ SUSP
20.0000 mg | INTRAMUSCULAR | Status: AC | PRN
  Administered 2018-11-13: 20 mg

## 2018-11-13 MED ORDER — LIDOCAINE HCL 1 % IJ SOLN
0.5000 mL | INTRAMUSCULAR | Status: AC | PRN
  Administered 2018-11-13: .5 mL

## 2018-11-13 NOTE — Progress Notes (Signed)
Office Visit Note   Patient: Kristin Livingston           Date of Birth: 1959/05/27           MRN: [id number] Visit Date: 11/13/2057              Requested by: Lindley Magnus, PA-C [address] [place], [place] [address] PCP: Lindley Magnus, PA-C   Assessment & Plan: Visit Diagnoses:  1. Trigger finger, right middle finger   2. Trigger little finger of left hand   3. Trigger finger, left middle finger     Plan: She does have pain over the A1 pulley of the right middle finger, left middle finger and left little finger.  She would benefit from a steroid injection in all 3 areas.  I do not feel that she needs EMG studies given the fact that she does not truly have numbness and tingling.  There is no muscle atrophy as well.  She tolerated injections well without any difficulty.  All questions concerns were answered and addressed.  Follow-up can be as needed.  She understands that she may need repeat injections if this continues to be problematic.  Follow-Up Instructions: Return if symptoms worsen or fail to improve.   Orders:  Orders Placed This Encounter  Procedures  . Hand/UE Inj  . Hand/UE Inj  . Hand/UE Inj   No orders of the defined types were placed in this encounter.     Procedures: Hand/UE Inj: R long A1 for trigger finger on 11/13/2018 10:37 AM Medications: 0.5 mL lidocaine 1 %; 20 mg methylPREDNISolone acetate 40 MG/ML  Hand/UE Inj: L long A1 for trigger finger on 11/13/2018 10:37 AM Medications: 0.5 mL lidocaine 1 %; 20 mg methylPREDNISolone acetate 40 MG/ML  Hand/UE Inj: L small A1 for trigger finger on 11/13/2018 10:37 AM Medications: 0.5 mL lidocaine 1 %; 20 mg methylPREDNISolone acetate 40 MG/ML      Clinical Data: No additional findings.   Subjective: Chief Complaint  Patient presents with  . Right Knee - Pain  . Left Knee - Pain  The patient comes in today with bilateral hand pain and a family history of rheumatoid disease.  She is describing is  triggering and pain at the A1 pulley of her right middle finger and her left middle finger and left ring finger.  She denies any significant numbness and tingling but does report hand pain.  This is been slowly getting worse with time.  HPI  Review of Systems She currently denies any headache, chest pain, shortness of breath, fever, chills, nausea, vomiting.  Objective: Vital Signs: There were no vitals taken for this visit.  Physical Exam She is alert and orient x3 and in no acute distress Ortho Exam Examination of her right hand shows obvious arthritic changes at the IP joints of her hands.  She does have pain over the A1 pulley of the middle finger.  There is no numbness and tingling and no muscle atrophy.  She does have weak grip strength.  Examination of her left hand shows pain over the A1 pulley of the fifth finger and middle finger but otherwise normal exam. Specialty Comments:  No specialty comments available.  Imaging: No results found.   PMFS History: Patient Active Problem List   Diagnosis Date Noted  . Tobacco abuse   . Esophageal reflux   . Chest pain with moderate risk for cardiac etiology 07/01/2015  . TOBACCO ABUSE 05/26/2010  . DM2 (diabetes mellitus,  type 2) (HCC) 05/24/2010  . HYPERLIPIDEMIA, MIXED 05/24/2010  . ALLERGIC RHINITIS 05/24/2010  . DYSPAREUNIA 05/24/2010  . SHOULDER PAIN 05/24/2010  . MYALGIA 05/24/2010  . UNSPECIFIED SLEEP DISTURBANCE 05/24/2010  . CHEST PAIN 05/24/2010  . NAUSEA 05/24/2010  . ABDOMINAL PAIN RIGHT LOWER QUADRANT 05/24/2010   Past Medical History:  Diagnosis Date  . Anemia   . Barrett's esophagus   . Dental bridge present    lower left  . Ethmoiditis 10/2017   right  . Leukocytosis    [facility]  . Maxillary sinusitis, chronic    bilateral  . Mixed hyperlipidemia   . Nasal turbinate hypertrophy 10/2017  . Non-insulin dependent type 2 diabetes mellitus (HCC)   . Sinus headache     Family History    Problem Relation Age of Onset  . Coronary artery disease Father   . Heart attack Sister        x2 prior to age 66    Past Surgical History:  Procedure Laterality Date  . ETHMOIDECTOMY Right 11/20/2017   Procedure: RIGHT ETHMOIDECTOMY;  Surgeon: Newman Pies, MD;  Location: [facility];  Service: ENT;  Laterality: Right;  . LAPAROSCOPIC CHOLECYSTECTOMY  04/19/2004  . MAXILLARY ANTROSTOMY Bilateral 11/20/2017   Procedure: BILATERAL MAXILLARY ANTROSTOMY;  Surgeon: Newman Pies, MD;  Location: [facility];  Service: ENT;  Laterality: Bilateral;  . SHOULDER ARTHROSCOPY Right    x 7  . SINUS ENDO WITH FUSION Bilateral 11/20/2017   Procedure: ENDOSCOPIC SINUS SURGERY WITH FUSION NAVIGATION;  Surgeon: Newman Pies, MD;  Location: [facility];  Service: ENT;  Laterality: Bilateral;  . TURBINATE RESECTION Bilateral 11/20/2017   Procedure: BILATERAL TURBINATE RESECTION;  Surgeon: Newman Pies, MD;  Location: [facility];  Service: ENT;  Laterality: Bilateral;  . VESICOVAGINAL FISTULA CLOSURE W/ TAH  1996   Social History   Occupational History  . Occupation: Full Time  Tobacco Use  . Smoking status: Current Every Day Smoker    Packs/day: 0.50    Years: 30.00    Pack years: 15.00    Types: Cigarettes  . Smokeless tobacco: Never Used  Substance and Sexual Activity  . Alcohol use: No  . Drug use: No    Comment: rarely  . Sexual activity: Yes    Birth control/protection: None

## 2020-07-15 ENCOUNTER — Ambulatory Visit: Payer: Self-pay

## 2020-07-15 ENCOUNTER — Ambulatory Visit: Payer: BLUE CROSS/BLUE SHIELD | Admitting: Orthopaedic Surgery

## 2020-07-15 DIAGNOSIS — M25511 Pain in right shoulder: Secondary | ICD-10-CM | POA: Diagnosis not present

## 2020-07-15 MED ORDER — TIZANIDINE HCL 4 MG PO TABS: 4.0000 mg | ORAL_TABLET | Freq: Three times a day (TID) | ORAL | 0 refills | Status: AC | PRN

## 2020-07-15 MED ORDER — METHYLPREDNISOLONE ACETATE 40 MG/ML IJ SUSP
40.0000 mg | INTRAMUSCULAR | Status: AC | PRN
  Administered 2020-07-15: 14:00:00 40 mg via INTRAMUSCULAR

## 2020-07-15 MED ORDER — LIDOCAINE HCL 1 % IJ SOLN
1.0000 mL | INTRAMUSCULAR | Status: AC | PRN
  Administered 2020-07-15: 14:00:00 1 mL

## 2020-07-15 NOTE — Progress Notes (Signed)
Office Visit Note   Patient: Kristin Livingston           Date of Birth: [birth year]           MRN: [id number] Visit Date: 07/15/2020              Requested by: Lindley Magnus, PA-C [address] [place],  [place] [address] PCP: Lindley Magnus, PA-C   Assessment & Plan: Visit Diagnoses:  1. Right shoulder pain, unspecified chronicity   2. Trigger point of shoulder region, right     Plan: I did speak with her about trying a trigger point injection and she actually requested this and said that she will watch her blood glucose closely.  She did tolerate it well.  My next step would be formal physical therapy.  She would like to hold off on that unless things worsen for her.  She will let us know.  I will also send in some Zanaflex because that has helped in the past.  Follow-Up Instructions: Return if symptoms worsen or fail to improve.   Orders:  Orders Placed This Encounter  Procedures  . Trigger Point Inj  . XR Shoulder Right   Meds ordered this encounter  Medications  . tiZANidine (ZANAFLEX) 4 MG tablet    Sig: Take 1 tablet (4 mg total) by mouth every 8 (eight) hours as needed for muscle spasms.    Dispense:  30 tablet    Refill:  0      Procedures: Trigger Point Inj  Date/Time: 07/15/2020 2:18 PM Performed by: Kathryne Hitch, MD Authorized by: Kathryne Hitch, MD   Indications:  Muscle spasm Total # of Trigger Points:  1 Location: back   Medications #1:  1 mL lidocaine 1 %; 40 mg methylPREDNISolone acetate 40 MG/ML     Clinical Data: No additional findings.   Subjective: Chief Complaint  Patient presents with  . Right Shoulder - Pain  The patient comes in today with chief complaint of right shoulder pain.  This is a shoulder actually performed arthroscopic surgery on 12 years ago.  However upon talking with her it sounds like it is trigger point pain around the parascapular area.  She had a MRI of her cervical spine in 2016 that did show  some degenerative changes of the lower cervical spine.  It is to the right side of her parascapular region where she points to and says that it hurts and she gets spasm in that area.  It does wake her up at night.  She is a diabetic and states that she has had a harder time with blood sugar control but recently it has been better.  She does report some right upper extremity weakness but denies any specific right shoulder pain itself.  HPI  Review of Systems She currently denies any headache, chest pain, shortness of breath, fever, chills, nausea, vomiting  Objective: Vital Signs: There were no vitals taken for this visit.  Physical Exam She is alert and orient x3 and in no acute distress Ortho Exam Examination of her shoulder shows that it moves smoothly and fluidly but there is some slight grinding around the subacromial outlet.  There is no significant pain in her shoulder but there is parascapular pain in area of trigger point in the parascapular area to the right side.  She has slightly weak grip strength in triceps strength on the right side comparing the right and left sides. Specialty Comments:  No specialty comments  available.  Imaging: XR Shoulder Right  Result Date: 07/15/2020 3 views of the right shoulder show no acute findings.    PMFS History: Patient Active Problem List   Diagnosis Date Noted  . Tobacco abuse   . Esophageal reflux   . Chest pain with moderate risk for cardiac etiology 07/01/2015  . TOBACCO ABUSE 05/26/2010  . DM2 (diabetes mellitus, type 2) (HCC) 05/24/2010  . HYPERLIPIDEMIA, MIXED 05/24/2010  . ALLERGIC RHINITIS 05/24/2010  . DYSPAREUNIA 05/24/2010  . SHOULDER PAIN 05/24/2010  . MYALGIA 05/24/2010  . UNSPECIFIED SLEEP DISTURBANCE 05/24/2010  . CHEST PAIN 05/24/2010  . NAUSEA 05/24/2010  . ABDOMINAL PAIN RIGHT LOWER QUADRANT 05/24/2010   Past Medical History:  Diagnosis Date  . Anemia   . Barrett's esophagus   . Dental bridge present     lower left  . Ethmoiditis 10/2017   right  . Leukocytosis    followed by [facility]  . Maxillary sinusitis, chronic    bilateral  . Mixed hyperlipidemia   . Nasal turbinate hypertrophy 10/2017  . Non-insulin dependent type 2 diabetes mellitus (HCC)   . Sinus headache     Family History  Problem Relation Age of Onset  . Coronary artery disease Father   . Heart attack Sister        x2 prior to age 63    Past Surgical History:  Procedure Laterality Date  . ETHMOIDECTOMY Right 11/20/2017   Procedure: RIGHT ETHMOIDECTOMY;  Surgeon: Newman Pies, MD;  Location: [facility];  Service: ENT;  Laterality: Right;  . LAPAROSCOPIC CHOLECYSTECTOMY  04/19/2004  . MAXILLARY ANTROSTOMY Bilateral 11/20/2017   Procedure: BILATERAL MAXILLARY ANTROSTOMY;  Surgeon: Newman Pies, MD;  Location: [facility];  Service: ENT;  Laterality: Bilateral;  . SHOULDER ARTHROSCOPY Right    x 7  . SINUS ENDO WITH FUSION Bilateral 11/20/2017   Procedure: ENDOSCOPIC SINUS SURGERY WITH FUSION NAVIGATION;  Surgeon: Newman Pies, MD;  Location: [facility];  Service: ENT;  Laterality: Bilateral;  . TURBINATE RESECTION Bilateral 11/20/2017   Procedure: BILATERAL TURBINATE RESECTION;  Surgeon: Newman Pies, MD;  Location: [facility];  Service: ENT;  Laterality: Bilateral;  . VESICOVAGINAL FISTULA CLOSURE W/ TAH  1996   Social History   Occupational History  . Occupation: Full Time  Tobacco Use  . Smoking status: Current Every Day Smoker    Packs/day: 0.50    Years: 30.00    Pack years: 15.00    Types: Cigarettes  . Smokeless tobacco: Never Used  Vaping Use  . Vaping Use: Never used  Substance and Sexual Activity  . Alcohol use: No  . Drug use: No    Comment: rarely  . Sexual activity: Yes    Birth control/protection: None

## 2020-07-27 ENCOUNTER — Other Ambulatory Visit: Payer: Self-pay

## 2020-07-27 ENCOUNTER — Ambulatory Visit (INDEPENDENT_AMBULATORY_CARE_PROVIDER_SITE_OTHER): Payer: BC Managed Care – PPO | Admitting: Orthopaedic Surgery

## 2020-07-27 ENCOUNTER — Encounter: Payer: Self-pay | Admitting: Orthopaedic Surgery

## 2020-07-27 DIAGNOSIS — M25511 Pain in right shoulder: Secondary | ICD-10-CM | POA: Diagnosis not present

## 2020-07-27 NOTE — Progress Notes (Signed)
The patient is still having significant right parascapular pain.  I tried a trigger point injection in this area and it did not help her according to the patient.  She says she is having trouble sleeping at night due to the pain she has.  She denies any neck pain or any numbness tingling or radicular symptoms in her right upper extremity.  She continues to deny any acute change in her medical status.  She denies any headache, chest pain, shortness of breath, fever, chills, nausea, vomiting.  On exam she still has parascapular pain to palpation along the medial border of the scapula.  There is no winging.  There is an area where she is point tender.  Certainly this could be a radicular type of pain from the cervical spine or even from thoracic spine but it is certainly point tender in this area.  Her right upper extremity exam is otherwise normal.  I have recommended now outpatient physical therapy with any modalities that therapist can provide in the parascapular area to the right side that would help calm down this pain that she is having.  This could even include dry needling.  I explained the treatment plan to the patient in detail and she does wish to proceed with outpatient physical therapy.

## 2020-08-11 ENCOUNTER — Ambulatory Visit: Payer: BC Managed Care – PPO | Admitting: Physical Therapy

## 2020-08-17 ENCOUNTER — Ambulatory Visit (INDEPENDENT_AMBULATORY_CARE_PROVIDER_SITE_OTHER): Payer: BC Managed Care – PPO | Admitting: Physical Therapy

## 2020-08-17 ENCOUNTER — Encounter: Payer: Self-pay | Admitting: Physical Therapy

## 2020-08-17 ENCOUNTER — Other Ambulatory Visit: Payer: Self-pay

## 2020-08-17 DIAGNOSIS — M25511 Pain in right shoulder: Secondary | ICD-10-CM | POA: Diagnosis not present

## 2020-08-17 DIAGNOSIS — M6281 Muscle weakness (generalized): Secondary | ICD-10-CM | POA: Diagnosis not present

## 2020-08-17 DIAGNOSIS — G8929 Other chronic pain: Secondary | ICD-10-CM

## 2020-08-17 DIAGNOSIS — M546 Pain in thoracic spine: Secondary | ICD-10-CM

## 2020-08-17 NOTE — Patient Instructions (Signed)
Access Code: [email or web address] URL: [email or web address] Date: 08/17/2020 Prepared by: Ivery Quale  Exercises Seated Thoracic Lumbar Extension - 2 x daily - 6 x weekly - 1-2 sets - 10 reps Standing Shoulder Posterior Capsule Stretch - 2 x daily - 3-4 x weekly - 1 sets - 2-3 reps - 30 hold Standing Row with Anchored Resistance - 2 x daily - 6 x weekly - 2-3 sets - 10-20 reps Shoulder extension with resistance - Neutral - 2 x daily - 6 x weekly - 2-3 sets - 10 reps Standing Shoulder External Rotation with Resistance - 2 x daily - 6 x weekly - 1-3 sets - 10 reps Standing Shoulder Horizontal Abduction with Resistance - 2 x daily - 6 x weekly - 3 sets - 10 reps

## 2020-08-17 NOTE — Therapy (Addendum)
[facility] Physical Therapy [address] [place], [place], [address] Phone: [phone]   Fax:  [phone]  Physical Therapy Evaluation/Discharge PHYSICAL THERAPY DISCHARGE SUMMARY  Visits from Start of Care: 1  Current functional level related to goals / functional outcomes: See below   Remaining deficits: See below   Education / Equipment:HEP Plan: Patient agrees to discharge.  Patient goals were not met. Patient is being discharged due to not returning since the last visit.  ?????  Elsie Ra, PT, DPT 12/02/20 3:13 PM      Patient Details  Name: Kristin Livingston MRN: [id number] Date of Birth: 1961/04/19 Referring Provider (PT): Mcarthur Rossetti, MD   Encounter Date: 08/17/2020   PT End of Session - 08/17/20 1543    Visit Number 1    Number of Visits 12    Date for PT Re-Evaluation 10/12/20    Authorization Type BCBS    PT Start Time 1430    PT Stop Time 1512    PT Time Calculation (min) 42 min    Activity Tolerance Patient tolerated treatment well    Behavior During Therapy [facility] for tasks assessed/performed           Past Medical History:  Diagnosis Date  . Anemia   . Barrett's esophagus   . Dental bridge present    lower left  . Ethmoiditis 10/2017   right  . Leukocytosis    followed by [facility]  . Maxillary sinusitis, chronic    bilateral  . Mixed hyperlipidemia   . Nasal turbinate hypertrophy 10/2017  . Non-insulin dependent type 2 diabetes mellitus ([place])   . Sinus headache     Past Surgical History:  Procedure Laterality Date  . ETHMOIDECTOMY Right 11/20/2017   Procedure: RIGHT ETHMOIDECTOMY;  Surgeon: Leta Baptist, MD;  Location: [place];  Service: ENT;  Laterality: Right;  . LAPAROSCOPIC CHOLECYSTECTOMY  04/19/2004  . MAXILLARY ANTROSTOMY Bilateral 11/20/2017   Procedure: BILATERAL MAXILLARY ANTROSTOMY;  Surgeon: Leta Baptist, MD;  Location: [place];  Service: ENT;   Laterality: Bilateral;  . SHOULDER ARTHROSCOPY Right    x 7  . SINUS ENDO WITH FUSION Bilateral 11/20/2017   Procedure: ENDOSCOPIC SINUS SURGERY WITH FUSION NAVIGATION;  Surgeon: Leta Baptist, MD;  Location: [place];  Service: ENT;  Laterality: Bilateral;  . TURBINATE RESECTION Bilateral 11/20/2017   Procedure: BILATERAL TURBINATE RESECTION;  Surgeon: Leta Baptist, MD;  Location: [place];  Service: ENT;  Laterality: Bilateral;  . VESICOVAGINAL FISTULA CLOSURE W/ TAH  1996    There were no vitals filed for this visit.    Subjective Assessment - 08/17/20 1428    Subjective Right shoulder pain with trigger points, thoracic pain for last 3 months without known MOI. Had injection and this did not help. She says she is having trouble sleeping at night due to the pain she has.  She denies any neck pain or any numbness tingling or radicular symptoms in her right upper extremity.  She continues to deny any acute change in her medical status.  She denies any headache, chest pain, shortness of breath, fever, chills, nausea, vomiting.    Pertinent History PMH: DM. Rt shoulder scope 9 years ago    Diagnostic tests 3 views of the right shoulder show no acute findings.    Patient Stated Goals get pain down with activity    Currently in Pain? Yes    Pain Score 8     Pain  Location Shoulder    Pain Orientation Right    Pain Descriptors / Indicators Aching    Pain Type Chronic pain    Pain Radiating Towards down the outsided of her Rt arm at times or to her neck    Pain Onset More than a month ago    Pain Frequency Intermittent    Aggravating Factors  ice,weed eat, yardwork,sleeping    Pain Relieving Factors heat              [facility] PT Assessment - 08/17/20 0001      Assessment   Medical Diagnosis Rt shoulder pain, scapular pain    Referring Provider (PT) Mcarthur Rossetti, MD    Onset Date/Surgical Date --   3 month onset of pain   Hand Dominance Right    Next  MD Visit PRN    Prior Therapy nothing recent      Precautions   Precautions None      Restrictions   Weight Bearing Restrictions No      Balance Screen   Has the patient fallen in the past 6 months No    Has the patient had a decrease in activity level because of a fear of falling?  No    Is the patient reluctant to leave their home because of a fear of falling?  No      Home Ecologist residence      Prior Function   Level of Independence Independent    Leisure yardwork, takes care of 6 grandkids      Cognition   Overall Cognitive Status Within Functional Limits for tasks assessed      Observation/Other Assessments   Focus on Therapeutic Outcomes (FOTO)  2nd visit      Posture/Postural Control   Posture Comments kyhposis      ROM / Strength   AROM / PROM / Strength Strength;AROM      AROM   AROM Assessment Site Shoulder;Cervical    Right/Left Shoulder Right    Right Shoulder Flexion 165 Degrees    Right Shoulder ABduction 130 Degrees    Right Shoulder Internal Rotation --   inferior border of scaular reaching behind back   Right Shoulder External Rotation --   T2 reaching behind back   Cervical Flexion 75%    Cervical Extension WNL    Cervical - Right Side Bend 75%    Cervical - Left Side Bend 75%    Cervical - Right Rotation WNL    Cervical - Left Rotation WNL      Strength   Overall Strength Comments Rt UE strength 5/5 except shoulder abd 4/5, ER 4/5, H abd 4/5, extension 4/5.      Special Tests   Other special tests +spurlings test on Rt but all other cervical testing negative for concordant pain symptoms. Impingment tests negative except for pain with horizontal adduction                      Objective measurements completed on examination: See above findings.       [place] Adult PT Treatment/Exercise - 08/17/20 0001      Modalities   Modalities Iontophoresis;Moist Heat      Moist Heat Therapy   Number  Minutes Moist Heat 10 Minutes    Moist Heat Location Shoulder      Iontophoresis   Type of Iontophoresis Dexamethasone    Location Rt rhomboid/mid trap    Dose  1.0 CC    Time 4 hour wear home patch                    PT Short Term Goals - 08/17/20 1612      PT SHORT TERM GOAL #1   Title Pt will be I and compliant with HEP.    Time 4    Status New    Target Date 09/14/20      PT SHORT TERM GOAL #2   Title Pt will perform FOTO outcome measure    Time 4    Period Weeks    Status New             PT Long Term Goals - 08/17/20 1614      PT LONG TERM GOAL #1   Title Pt will meet predicted FOTO functional score    Time 8    Period Weeks    Status New      PT LONG TERM GOAL #2   Title Pt will improve Rt shoulder strength to 5/5 MMT    Time 8    Period Weeks    Status New      PT LONG TERM GOAL #3   Title Pt will improve Rt shoulder AROM to [facility]    Time 8    Period Weeks    Status New      PT LONG TERM GOAL #4   Title Pt will report less than 3/10 overall with ususal activity, sleeping, and yardwork.    Time 8    Period Weeks    Status New                  Plan - 08/17/20 1543    Clinical Impression Statement Pt presents with Rt Right shoulder pain with trigger points, thoracic pain. This presented more as muscular pain and strain and not radicular pain or impingment today. She will benefit from skilled PT to address her functional deficits and decrease pain.    Personal Factors and Comorbidities Comorbidity 1    Comorbidities DM    Examination-Activity Limitations Carry;Lift;Reach Overhead    Examination-Participation Restrictions Cleaning;Valla Leaver [facility]    Stability/Clinical Decision Making Stable/Uncomplicated    Clinical Decision Making Low    Rehab Potential Good    PT Frequency 2x / week   1-2   PT Duration 8 weeks    PT Treatment/Interventions ADLs/Self Care Home Management;Electrical Stimulation;Iontophoresis 82m/ml  Dexamethasone;Moist Heat;Ultrasound;Therapeutic activities;Therapeutic exercise;Neuromuscular re-education;Manual techniques;Passive range of motion;Dry needling;Joint Manipulations;Spinal Manipulations;Taping;Vasopneumatic Device    PT Next Visit Plan review and update HEP    PT Home Exercise Plan Access Code: [email or web address]    Consulted and Agree with Plan of Care Patient           Patient will benefit from skilled therapeutic intervention in order to improve the following deficits and impairments:  Decreased activity tolerance, Decreased range of motion, Decreased strength, Hypomobility, Impaired flexibility, Postural dysfunction, Pain  Visit Diagnosis: Chronic right shoulder pain  Pain in thoracic spine  Muscle weakness (generalized)     Problem List Patient Active Problem List   Diagnosis Date Noted  . Tobacco abuse   . Esophageal reflux   . Chest pain with moderate risk for cardiac etiology 07/01/2015  . TOBACCO ABUSE 05/26/2010  . DM2 (diabetes mellitus, type 2) ([place] 05/24/2010  . HYPERLIPIDEMIA, MIXED 05/24/2010  . ALLERGIC RHINITIS 05/24/2010  . DYSPAREUNIA 05/24/2010  . SHOULDER PAIN 05/24/2010  . MYALGIA 05/24/2010  . UNSPECIFIED  SLEEP DISTURBANCE 05/24/2010  . CHEST PAIN 05/24/2010  . NAUSEA 05/24/2010  . ABDOMINAL PAIN RIGHT LOWER QUADRANT 05/24/2010    Silvestre Mesi 08/17/2020, 4:18 PM  [facility] [address] [place], [place], [address] Phone: [phone]   Fax:  [phone]  Name: Kristin Livingston MRN: [id number] Date of Birth: 08-30-1961

## 2020-08-25 ENCOUNTER — Encounter: Payer: BC Managed Care – PPO | Admitting: Physical Therapy

## 2020-08-27 ENCOUNTER — Encounter: Payer: BC Managed Care – PPO | Admitting: Physical Therapy

## 2020-08-31 ENCOUNTER — Encounter: Payer: BC Managed Care – PPO | Admitting: Physical Therapy

## 2020-08-31 ENCOUNTER — Telehealth: Payer: Self-pay | Admitting: Physical Therapy

## 2020-08-31 NOTE — Telephone Encounter (Signed)
I called and spoke to pt this morning at 9:00 am after she did not show for her 8:45 treatment session. Pt reported she didn't realize that her appointment was today. Pt requesting to cancel all of her previously made appointments due to having to take care of her friend with a brain tumor. She reported she has been doing her home exercises and doing a little better. Pt was instructed to call MD for new referral if she felt she was no longer making progress and needed further PT services.   Narda Amber, PT 08/31/20 9:15 AM

## 2020-09-02 ENCOUNTER — Encounter: Payer: BC Managed Care – PPO | Admitting: Physical Therapy

## 2020-09-07 ENCOUNTER — Encounter: Payer: BC Managed Care – PPO | Admitting: Physical Therapy

## 2020-09-09 ENCOUNTER — Encounter: Payer: BC Managed Care – PPO | Admitting: Physical Therapy

## 2020-09-14 ENCOUNTER — Encounter: Payer: BC Managed Care – PPO | Admitting: Physical Therapy

## 2020-09-16 ENCOUNTER — Encounter: Payer: BC Managed Care – PPO | Admitting: Physical Therapy

## 2020-10-09 ENCOUNTER — Encounter: Payer: Self-pay | Admitting: Endocrinology

## 2021-03-17 ENCOUNTER — Other Ambulatory Visit: Payer: Self-pay

## 2021-03-17 ENCOUNTER — Encounter: Payer: Self-pay | Admitting: Orthopaedic Surgery

## 2021-03-17 ENCOUNTER — Ambulatory Visit: Payer: BC Managed Care – PPO | Admitting: Orthopaedic Surgery

## 2021-03-17 DIAGNOSIS — M25511 Pain in right shoulder: Secondary | ICD-10-CM

## 2021-03-17 DIAGNOSIS — M542 Cervicalgia: Secondary | ICD-10-CM

## 2021-03-17 MED ORDER — CYCLOBENZAPRINE HCL 10 MG PO TABS: 10.0000 mg | ORAL_TABLET | Freq: Three times a day (TID) | ORAL | 0 refills | Status: AC | PRN

## 2021-03-17 NOTE — Progress Notes (Signed)
Office Visit Note   Patient: Kristin Livingston           Date of Birth: Feb 14, 1961           MRN: [id number] Visit Date: 03/17/2021              Requested by: Rick Duff, PA-C No address on file PCP: Rick Duff, PA-C   Assessment & Plan: Visit Diagnoses:  1. Cervicalgia   2. Right shoulder pain, unspecified chronicity     Plan: At this point time we will send her to physical therapy to work on range of motion and neck and shoulder.  They will include modalities home exercise program and stretching exercises.  She will follow-up with [place] in 1 month to see what type of response she had to the physical therapy.  Refill on her Flexeril sent  Follow-Up Instructions: Return in about 4 weeks (around 04/14/2021).   Orders:  No orders of the defined types were placed in this encounter.  No orders of the defined types were placed in this encounter.     Procedures: No procedures performed   Clinical Data: No additional findings.   Subjective: Chief Complaint  Patient presents with  . Right Shoulder - Follow-up    HPI Kristin Livingston comes in today for right shoulder pain that started 1 to 2 weeks ago.  She was last seen in October 2021 and was given a trigger point injection for right shoulder pain.  She feels that she did well.  She states that she is having pain in her right scapular area and feels as if something needs to pop.  She has no real radicular symptoms down the arm but does have some spasms into her right humerus to the elbow.  She notes she has some neck pain.  Pain does awaken her.  States range of motion of the neck causes her no pain however if she has armrest hanging down or if its swinging and not in her pocket that she has pain in the right scapular area.  She is also recently had pneumonia about 2 months ago and recently diagnosed with [facility] spotted fever for which she is on doxycycline.  She states she was diagnosed With fever month ago.  She  is diabetic unsure of her last hemoglobin A1c.  Her hemoglobin A1c and October 2021 was 8.8.  She does feel like she has had poor control of her glucose levels in the last few months due to the pneumonia and recommend spotted fever.  She unfortunately did not get a physical therapy for her shoulder and neck. Radiographs right shoulder dated 07/15/2020 are reviewed and showed no acute fractures no bony abnormalities no significant arthritic changes.  Shoulder is well located. MRI in 2016 cervical spine did show asymmetric broad-based posterior disc osteophyte at C6-C7 that was felt to potentially slightly left C7 nerve root.  Otherwise mild to moderate facet arthritis C3 3 through C6.  Review of Systems  Constitutional: Negative for chills and fever.     Objective: Vital Signs: There were no vitals taken for this visit.  Physical Exam Constitutional:      Appearance: She is normal weight. She is not ill-appearing or diaphoretic.  Pulmonary:     Effort: Pulmonary effort is normal.  Neurological:     Mental Status: She is alert and oriented to person, place, and time.  Psychiatric:        Mood and Affect: Mood normal.  Ortho Exam Cervical spine good range of motion cervical spine without pain.  Positive Spurling's.  She has tenderness over the right trapezius region and into the right medial border of the scapula.  Upper extremities 5 strength throughout against resistance.  Positive impingement testing right shoulder.  She has discomfort with overhead activity but has full overhead activity of both arms.  Sensation grossly intact bilateral hands. Specialty Comments:  No specialty comments available.  Imaging: No results found.   PMFS History: Patient Active Problem List   Diagnosis Date Noted  . Tobacco abuse   . Esophageal reflux   . Chest pain with moderate risk for cardiac etiology 07/01/2015  . TOBACCO ABUSE 05/26/2010  . DM2 (diabetes mellitus, type 2) ([facility]) 05/24/2010   . HYPERLIPIDEMIA, MIXED 05/24/2010  . ALLERGIC RHINITIS 05/24/2010  . DYSPAREUNIA 05/24/2010  . SHOULDER PAIN 05/24/2010  . MYALGIA 05/24/2010  . UNSPECIFIED SLEEP DISTURBANCE 05/24/2010  . CHEST PAIN 05/24/2010  . NAUSEA 05/24/2010  . ABDOMINAL PAIN RIGHT LOWER QUADRANT 05/24/2010   Past Medical History:  Diagnosis Date  . Anemia   . Barrett's esophagus   . Dental bridge present    lower left  . Ethmoiditis 10/2017   right  . Leukocytosis    followed by [facility]  . Maxillary sinusitis, chronic    bilateral  . Mixed hyperlipidemia   . Nasal turbinate hypertrophy 10/2017  . Non-insulin dependent type 2 diabetes mellitus ([facility])   . Sinus headache     Family History  Problem Relation Age of Onset  . Coronary artery disease Father   . Heart attack Sister        x2 prior to age 61    Past Surgical History:  Procedure Laterality Date  . ETHMOIDECTOMY Right 11/20/2017   Procedure: RIGHT ETHMOIDECTOMY;  Surgeon: Newman Pies, MD;  Location: [facility];  Service: ENT;  Laterality: Right;  . LAPAROSCOPIC CHOLECYSTECTOMY  04/19/2004  . MAXILLARY ANTROSTOMY Bilateral 11/20/2017   Procedure: BILATERAL MAXILLARY ANTROSTOMY;  Surgeon: Newman Pies, MD;  Location: [facility];  Service: ENT;  Laterality: Bilateral;  . SHOULDER ARTHROSCOPY Right    x 7  . SINUS ENDO WITH FUSION Bilateral 11/20/2017   Procedure: ENDOSCOPIC SINUS SURGERY WITH FUSION NAVIGATION;  Surgeon: Newman Pies, MD;  Location: [facility];  Service: ENT;  Laterality: Bilateral;  . TURBINATE RESECTION Bilateral 11/20/2017   Procedure: BILATERAL TURBINATE RESECTION;  Surgeon: Newman Pies, MD;  Location: [facility];  Service: ENT;  Laterality: Bilateral;  . VESICOVAGINAL FISTULA CLOSURE W/ TAH  1996   Social History   Occupational History  . Occupation: Full Time  Tobacco Use  . Smoking status: Current Every Day Smoker    Packs/day: 0.50    Years: 30.00    Pack  years: 15.00    Types: Cigarettes  . Smokeless tobacco: Never Used  Vaping Use  . Vaping Use: Never used  Substance and Sexual Activity  . Alcohol use: No  . Drug use: No    Comment: rarely  . Sexual activity: Yes    Birth control/protection: None

## 2021-03-31 ENCOUNTER — Encounter: Payer: Self-pay | Admitting: Rehabilitative and Restorative Service Providers"

## 2021-03-31 ENCOUNTER — Ambulatory Visit (INDEPENDENT_AMBULATORY_CARE_PROVIDER_SITE_OTHER): Payer: BC Managed Care – PPO | Admitting: Rehabilitative and Restorative Service Providers"

## 2021-03-31 ENCOUNTER — Other Ambulatory Visit: Payer: Self-pay

## 2021-03-31 DIAGNOSIS — M6281 Muscle weakness (generalized): Secondary | ICD-10-CM | POA: Diagnosis not present

## 2021-03-31 DIAGNOSIS — R293 Abnormal posture: Secondary | ICD-10-CM

## 2021-03-31 DIAGNOSIS — M542 Cervicalgia: Secondary | ICD-10-CM

## 2021-03-31 DIAGNOSIS — M25511 Pain in right shoulder: Secondary | ICD-10-CM | POA: Diagnosis not present

## 2021-03-31 DIAGNOSIS — G8929 Other chronic pain: Secondary | ICD-10-CM

## 2021-03-31 NOTE — Therapy (Signed)
[facility] Physical Therapy [address] [place], [place], [address] Phone: [phone]   Fax:  [phone]  Physical Therapy Evaluation  Patient Details  Name: Kristin Livingston MRN: [id number] Date of Birth: 22-Apr-1961 Referring Provider (PT): Dr. Doneen Poisson   Encounter Date: 03/31/2021   PT End of Session - 03/31/21 0843    Visit Number 1    Number of Visits 12    Date for PT Re-Evaluation 05/26/21    Progress Note Due on Visit 10    PT Start Time 0845    PT Stop Time 0922    PT Time Calculation (min) 37 min    Activity Tolerance Patient tolerated treatment well    Behavior During Therapy [facility] for tasks assessed/performed           Past Medical History:  Diagnosis Date  . Anemia   . Barrett's esophagus   . Dental bridge present    lower left  . Ethmoiditis 10/2017   right  . Leukocytosis    followed by [facility]  . Maxillary sinusitis, chronic    bilateral  . Mixed hyperlipidemia   . Nasal turbinate hypertrophy 10/2017  . Non-insulin dependent type 2 diabetes mellitus (HCC)   . Sinus headache     Past Surgical History:  Procedure Laterality Date  . ETHMOIDECTOMY Right 11/20/2017   Procedure: RIGHT ETHMOIDECTOMY;  Surgeon: Newman Pies, MD;  Location: [facility];  Service: ENT;  Laterality: Right;  . LAPAROSCOPIC CHOLECYSTECTOMY  04/19/2004  . MAXILLARY ANTROSTOMY Bilateral 11/20/2017   Procedure: BILATERAL MAXILLARY ANTROSTOMY;  Surgeon: Newman Pies, MD;  Location: [facility];  Service: ENT;  Laterality: Bilateral;  . SHOULDER ARTHROSCOPY Right    x 7  . SINUS ENDO WITH FUSION Bilateral 11/20/2017   Procedure: ENDOSCOPIC SINUS SURGERY WITH FUSION NAVIGATION;  Surgeon: Newman Pies, MD;  Location: [facility];  Service: ENT;  Laterality: Bilateral;  . TURBINATE RESECTION Bilateral 11/20/2017   Procedure: BILATERAL TURBINATE RESECTION;  Surgeon: Newman Pies, MD;  Location: [facility];   Service: ENT;  Laterality: Bilateral;  . VESICOVAGINAL FISTULA CLOSURE W/ TAH  1996    There were no vitals filed for this visit.    Subjective Assessment - 03/31/21 0849    Subjective Pt. indicated chronic symptoms for Rt shoulder pain dating back to last fall (had one visit of physicla therapy, injection given).  Pt. stated complaints have gotten worse over last month with complaints of pain in Rt posterior shoulder, scapular region with some Rt cervical pain with occasional tingling into Rt arm.  Pain noted c arm movements overhead.  Pt. stated waking occasionally due to pain but able to get back to sleep.    Limitations House hold activities;Lifting    Patient Stated Goals Reduce pain    Currently in Pain? Yes    Pain Score 6    pain at worst 8/10   Pain Location Shoulder   Rt shoulder posterior, Rt lower cervical   Pain Orientation Right    Pain Descriptors / Indicators Stabbing;Other (Comment)   twisting   Pain Type Chronic pain    Pain Onset More than a month ago    Pain Frequency Constant    Aggravating Factors  overhead reaching, yard work (after work pain), occasional prolonged sitting    Pain Relieving Factors resting, heating pad for temporary relief              [facility] PT Assessment - 03/31/21  0001      Assessment   Medical Diagnosis Rt shoulder pain, cervical spine pain    Referring Provider (PT) Dr. Doneen Poissonhristopher Blackman    Onset Date/Surgical Date 02/21/21    Hand Dominance Right      Precautions   Precautions None      Balance Screen   Has the patient fallen in the past 6 months No    Has the patient had a decrease in activity level because of a fear of falling?  No    Is the patient reluctant to leave their home because of a fear of falling?  No      Home [employer]   Living Environment Private residence    Living Arrangements Spouse/significant other      Prior Function   Vocation Retired    Leisure Yard work      Observation/Other Assessments    Focus on Therapeutic Outcomes (FOTO)  intake 41 %, predicted 63%      Sensation   Light Touch Appears Intact      Posture/Postural Control   Posture Comments rounded shoulders, mild FHP      ROM / Strength   AROM / PROM / Strength Strength;PROM;AROM      AROM   Overall AROM Comments Rt shoulder flexion approx. 85% of Lt c pain noted in shoulder c movement against gravity.    AROM Assessment Site Cervical;Shoulder    Right/Left Shoulder Left;Right    Cervical Flexion 45   tightness on Rt cervical lower/upper trap   Cervical Extension 70    Cervical - Right Rotation 68   pulling on Rt more   Cervical - Left Rotation 58      Strength   Overall Strength Comments Pain noted in flexion, abduction MMT on Rt shoulder    Strength Assessment Site Elbow;Shoulder    Right/Left Shoulder Right;Left    Right Shoulder Flexion 4/5    Right Shoulder ABduction 4/5    Right Shoulder Internal Rotation 5/5    Right Shoulder External Rotation 5/5    Left Shoulder Flexion 5/5    Left Shoulder ABduction 5/5    Left Shoulder Internal Rotation 5/5    Left Shoulder External Rotation 5/5    Right/Left Elbow Right;Left    Right Elbow Flexion 5/5    Right Elbow Extension 5/5    Left Elbow Flexion 5/5    Left Elbow Extension 5/5      Palpation   Palpation comment Trigger points c concordant symptoms in Rt upper trap, levator scap, infraspinatus                      Objective measurements completed on examination: See above findings.       [facility] Adult [facility] Treatment/Exercise - 03/31/21 0001      Exercises   Exercises Neck;Shoulder;Other Exercises    Other Exercises  HEP instruction/performance c cues for techniques, handout provided.  Trial set performed of each for comprehension and symptom assessment.  HEP consisting of Rt upper trap stretch, levator stretch, cross arm stretch, scapular retraction per handout      Modalities   Modalities Moist Heat      Manual Therapy   Manual  therapy comments compression to Rt upper trap, Rt infraspinatus            Trigger Point Dry Needling - 03/31/21 0001    Consent Given? Yes    Muscles Treated Head and Neck Upper trapezius   Rt  Upper Trapezius Response Twitch reponse elicited                PT Education - 03/31/21 0843    Education Details HEP, POC, DN    Person(s) Educated Patient    Methods Explanation;Demonstration;Verbal cues;Handout    Comprehension Returned demonstration;Verbalized understanding            PT Short Term Goals - 03/31/21 0843      PT SHORT TERM GOAL #1   Title Patient will demonstrate independent use of home exercise program to maintain progress from in clinic treatments.    Time 3    Period Weeks    Status New    Target Date 04/21/21             PT Long Term Goals - 03/31/21 0843      PT LONG TERM GOAL #1   Title Patient will demonstrate/report pain at worst less than or equal to 2/10 to facilitate minimal limitation in daily activity secondary to pain symptoms.    Time 8    Period Weeks    Status New    Target Date 05/26/21      PT LONG TERM GOAL #2   Title Patient will demonstrate independent use of home exercise program to facilitate ability to maintain/progress functional gains from skilled physical therapy services.    Time 8    Period Weeks    Status New    Target Date 05/26/21      PT LONG TERM GOAL #3   Title Pt. will demonstrate FOTO outcome score > or = goal 63  to indicated reduced disability due to symptoms.    Time 8    Period Weeks    Status New    Target Date 05/26/21      PT LONG TERM GOAL #4   Title Pt. will demonstrate cervical AROM WFL s symptoms to facilitate ability to perform usual daily mobility at PLOF.    Time 8    Period Weeks    Status New    Target Date 05/26/21      PT LONG TERM GOAL #5   Title Pt. will demonstrate Rt shoulder ROM, strength equal to Lt s restriction for full return to yard work activity.    Time 8     Period Weeks    Status New    Target Date 05/26/21                  Plan - 03/31/21 0844    Clinical Impression Statement Patient is a 60 y.o. who comes to clinic with complaints of cervical, Rt shoulder pain with mobility, strength and movement coordination deficits that impair their ability to perform usual daily and recreational functional activities without increase difficulty/symptoms at this time.  Patient to benefit from skilled PT services to address impairments and limitations to improve to previous level of function without restriction secondary to condition.    Personal Factors and Comorbidities Comorbidity 3+    Comorbidities pneumonia history, [place] fever dx, DM2    Examination-Activity Limitations Sleep;Carry;Lift;Reach Overhead    Examination-Participation Restrictions Yard Work;Cleaning;Community Activity    Stability/Clinical Decision Making Stable/Uncomplicated    Clinical Decision Making Low    Rehab Potential Good    PT Frequency Other (comment)   1-2x/week   PT Duration 8 weeks    PT Treatment/Interventions ADLs/Self Care Home Management;Cryotherapy;Electrical Stimulation;Iontophoresis 4mg /ml Dexamethasone;Moist Heat;Traction;Balance training;Therapeutic exercise;Therapeutic activities;Functional mobility training;Stair training;Gait training;Ultrasound;Neuromuscular re-education;Patient/family education;Passive range of  motion;Spinal Manipulations;Joint Manipulations;Dry needling;Taping;Manual techniques    PT Next Visit Plan Review existing HEP knowledge, dry needling, manual myofascial release techniques for Rt upper trap, infraspinatus.  Shoulder resistance strengthening ER/IR, lower trap, middle trap    PT Home Exercise Plan [email or web address]    Consulted and Agree with Plan of Care Patient           Patient will benefit from skilled therapeutic intervention in order to improve the following deficits and impairments:  Hypomobility,Pain,Impaired UE  functional use,Increased fascial restricitons,Decreased strength,Decreased activity tolerance,Decreased mobility,Improper body mechanics,Impaired perceived functional ability,Postural dysfunction,Impaired flexibility,Decreased range of motion  Visit Diagnosis: Cervicalgia  Chronic right shoulder pain  Muscle weakness (generalized)  Abnormal posture     Problem List Patient Active Problem List   Diagnosis Date Noted  . Tobacco abuse   . Esophageal reflux   . Chest pain with moderate risk for cardiac etiology 07/01/2015  . TOBACCO ABUSE 05/26/2010  . DM2 (diabetes mellitus, type 2) (HCC) 05/24/2010  . HYPERLIPIDEMIA, MIXED 05/24/2010  . ALLERGIC RHINITIS 05/24/2010  . DYSPAREUNIA 05/24/2010  . SHOULDER PAIN 05/24/2010  . MYALGIA 05/24/2010  . UNSPECIFIED SLEEP DISTURBANCE 05/24/2010  . CHEST PAIN 05/24/2010  . NAUSEA 05/24/2010  . ABDOMINAL PAIN RIGHT LOWER QUADRANT 05/24/2010    Chyrel Masson, PT, DPT, OCS, ATC 03/31/21  9:26 AM    [facility] Physical Therapy [address] [place], [place], [address] Phone: [phone]   Fax:  [phone]  Name: RAMINA HULET MRN: [id number] Date of Birth: Jul 24, 1961

## 2021-03-31 NOTE — Patient Instructions (Signed)
Access Code: [email or web address] URL: [email or web address] Date: 03/31/2021 Prepared by: Chyrel Masson  Exercises Seated Scapular Retraction - 2 x daily - 7 x weekly - 2 sets - 10 reps - 5 hold Seated Upper Trapezius Stretch - 2 x daily - 7 x weekly - 1 sets - 5 reps - 15 hold Standing Shoulder Posterior Capsule Stretch (Mirrored) - 2 x daily - 7 x weekly - 1 sets - 5 reps - 15-30 hold Gentle Levator Scapulae Stretch (Mirrored) - 2 x daily - 7 x weekly - 1 sets - 5 reps - 15 hold

## 2021-04-08 ENCOUNTER — Ambulatory Visit (INDEPENDENT_AMBULATORY_CARE_PROVIDER_SITE_OTHER): Payer: BC Managed Care – PPO | Admitting: Rehabilitative and Restorative Service Providers"

## 2021-04-08 ENCOUNTER — Encounter: Payer: Self-pay | Admitting: Rehabilitative and Restorative Service Providers"

## 2021-04-08 ENCOUNTER — Other Ambulatory Visit: Payer: Self-pay

## 2021-04-08 DIAGNOSIS — R293 Abnormal posture: Secondary | ICD-10-CM | POA: Diagnosis not present

## 2021-04-08 DIAGNOSIS — G8929 Other chronic pain: Secondary | ICD-10-CM

## 2021-04-08 DIAGNOSIS — M6281 Muscle weakness (generalized): Secondary | ICD-10-CM

## 2021-04-08 DIAGNOSIS — M542 Cervicalgia: Secondary | ICD-10-CM | POA: Diagnosis not present

## 2021-04-08 DIAGNOSIS — M25511 Pain in right shoulder: Secondary | ICD-10-CM

## 2021-04-08 NOTE — Patient Instructions (Signed)
Access Code: [email or web address] URL: [email or web address] Date: 04/08/2021 Prepared by: Chyrel Masson  Exercises Seated Scapular Retraction - 2 x daily - 7 x weekly - 2 sets - 10 reps - 5 hold Seated Upper Trapezius Stretch - 2 x daily - 7 x weekly - 1 sets - 5 reps - 15 hold Standing Shoulder Posterior Capsule Stretch (Mirrored) - 2 x daily - 7 x weekly - 1 sets - 5 reps - 15-30 hold Gentle Levator Scapulae Stretch (Mirrored) - 2 x daily - 7 x weekly - 1 sets - 5 reps - 15 hold Standing Shoulder Row with Anchored Resistance - 1 x daily - 7 x weekly - 3 sets - 10 reps Shoulder Extension with Resistance - 1 x daily - 7 x weekly - 3 sets - 10 reps Shoulder External Rotation with Anchored Resistance - 1 x daily - 7 x weekly - 3 sets - 10 reps

## 2021-04-08 NOTE — Therapy (Addendum)
[facility] Physical Therapy [address] [place], [place], [address] Phone: [phone]   Fax:  [phone]  Physical Therapy Treatment/Discharge  Patient Details  Name: Kristin Livingston MRN: [id number] Date of Birth: 11-Jan-1961 Referring Provider (PT): Dr. Jean Rosenthal   Encounter Date: 04/08/2021   PT End of Session - 04/08/21 0759     Visit Number 2    Number of Visits 12    Date for PT Re-Evaluation 05/26/21    Progress Note Due on Visit 10    PT Start Time 0759    PT Stop Time 0839    PT Time Calculation (min) 40 min    Activity Tolerance Patient tolerated treatment well    Behavior During Therapy [facility] for tasks assessed/performed             Past Medical History:  Diagnosis Date   Anemia    Barrett's esophagus    Dental bridge present    lower left   Ethmoiditis 10/2017   right   Leukocytosis    followed by [facility]   Maxillary sinusitis, chronic    bilateral   Mixed hyperlipidemia    Nasal turbinate hypertrophy 10/2017   Non-insulin dependent type 2 diabetes mellitus ([place])    Sinus headache     Past Surgical History:  Procedure Laterality Date   ETHMOIDECTOMY Right 11/20/2017   Procedure: RIGHT ETHMOIDECTOMY;  Surgeon: Leta Baptist, MD;  Location: [place];  Service: ENT;  Laterality: Right;   LAPAROSCOPIC CHOLECYSTECTOMY  04/19/2004   MAXILLARY ANTROSTOMY Bilateral 11/20/2017   Procedure: BILATERAL MAXILLARY ANTROSTOMY;  Surgeon: Leta Baptist, MD;  Location: [place];  Service: ENT;  Laterality: Bilateral;   SHOULDER ARTHROSCOPY Right    x 7   SINUS ENDO WITH FUSION Bilateral 11/20/2017   Procedure: ENDOSCOPIC SINUS SURGERY WITH FUSION NAVIGATION;  Surgeon: Leta Baptist, MD;  Location: [place];  Service: ENT;  Laterality: Bilateral;   TURBINATE RESECTION Bilateral 11/20/2017   Procedure: BILATERAL TURBINATE RESECTION;  Surgeon: Leta Baptist, MD;  Location: [place];   Service: ENT;  Laterality: Bilateral;   VESICOVAGINAL FISTULA CLOSURE W/ TAH  1996    There were no vitals filed for this visit.   Subjective Assessment - 04/08/21 0801     Subjective Pt. stated feeling similar overall with spasms still noticed.  Pt. stated really sore for a time after visit but that went away.  Pt. stated trying to do the exercises.    Limitations House hold activities;Lifting    Patient Stated Goals Reduce pain    Currently in Pain? Yes    Pain Score 7     Pain Location Shoulder    Pain Orientation Right    Pain Descriptors / Indicators Sore;Spasm    Pain Type Chronic pain    Pain Onset More than a month ago    Pain Frequency Constant    Aggravating Factors  arm use    Pain Relieving Factors heat pad                               [facility] Adult PT Treatment/Exercise - 04/08/21 0001       Shoulder Exercises: Standing   Other Standing Exercises rows, gh ext 2 x 10 green band, ER c towel at side 2 x 10 Rt      Shoulder Exercises: ROM/Strengthening   UBE (Upper Arm Bike) Lvl 3 4  mins fwd, 3 mins backward      Shoulder Exercises: Stretch   Other Shoulder Stretches cross arm stretch 15 sec x 5 Rt      Modalities   Modalities Moist Heat      Moist Heat Therapy   Number Minutes Moist Heat 5 Minutes   c stretching   Moist Heat Location Other (comment)   Rt cervical/shoulder     Manual Therapy   Manual therapy comments compression to Rt infraspinatus, upper trap, skilled palpation to same for dry needling      Neck Exercises: Stretches   Upper Trapezius Stretch 5 reps;Right   15 sec hold             Trigger Point Dry Needling - 04/08/21 0001     Muscles Treated Head and Neck Upper trapezius   Rt   Muscles Treated Upper Quadrant Infraspinatus   Rt   Upper Trapezius Response Twitch reponse elicited    Infraspinatus Response Twitch response elicited                  PT Education - 04/08/21 0819     Education Details HEP  progression    Person(s) Educated Patient    Methods Explanation;Demonstration;Verbal cues;Handout    Comprehension Verbalized understanding;Returned demonstration              PT Short Term Goals - 04/08/21 0813       PT SHORT TERM GOAL #1   Title Patient will demonstrate independent use of home exercise program to maintain progress from in clinic treatments.    Time 3    Period Weeks    Status On-going    Target Date 04/21/21               PT Long Term Goals - 03/31/21 0843       PT LONG TERM GOAL #1   Title Patient will demonstrate/report pain at worst less than or equal to 2/10 to facilitate minimal limitation in daily activity secondary to pain symptoms.    Time 8    Period Weeks    Status New    Target Date 05/26/21      PT LONG TERM GOAL #2   Title Patient will demonstrate independent use of home exercise program to facilitate ability to maintain/progress functional gains from skilled physical therapy services.    Time 8    Period Weeks    Status New    Target Date 05/26/21      PT LONG TERM GOAL #3   Title Pt. will demonstrate FOTO outcome score > or = goal 63  to indicated reduced disability due to symptoms.    Time 8    Period Weeks    Status New    Target Date 05/26/21      PT LONG TERM GOAL #4   Title Pt. will demonstrate cervical AROM WFL s symptoms to facilitate ability to perform usual daily mobility at PLOF.    Time 8    Period Weeks    Status New    Target Date 05/26/21      PT LONG TERM GOAL #5   Title Pt. will demonstrate Rt shoulder ROM, strength equal to Lt s restriction for full return to yard work activity.    Time 8    Period Weeks    Status New    Target Date 05/26/21  Plan - 04/08/21 0813     Clinical Impression Statement Reassessment of upper trap and infraspinatus today revealed continued tenderness c trigger points and localized symptoms reported as concordant to chief complaint.  Good  knowledge of existing HEP upon review today.  Added scapular and rotator cuff strengthening in HEP today.    Personal Factors and Comorbidities Comorbidity 3+    Comorbidities pneumonia history, [place] fever dx, DM2    Examination-Activity Limitations Sleep;Carry;Lift;Reach Overhead    Examination-Participation Restrictions Yard Work;Cleaning;Community Activity    Stability/Clinical Decision Making Stable/Uncomplicated    Rehab Potential Good    PT Frequency Other (comment)   1-2x/week   PT Duration 8 weeks    PT Treatment/Interventions ADLs/Self Care Home Management;Cryotherapy;Electrical Stimulation;Iontophoresis 47m/ml Dexamethasone;Moist Heat;Traction;Balance training;Therapeutic exercise;Therapeutic activities;Functional mobility training;Stair training;Gait training;Ultrasound;Neuromuscular re-education;Patient/family education;Passive range of motion;Spinal Manipulations;Joint Manipulations;Dry needling;Taping;Manual techniques    PT Next Visit Plan Dry needling if desired, progres middle trap, lower trap strengthening.    PT Home Exercise Plan [email or web address]    Consulted and Agree with Plan of Care Patient             Patient will benefit from skilled therapeutic intervention in order to improve the following deficits and impairments:  Hypomobility, Pain, Impaired UE functional use, Increased fascial restricitons, Decreased strength, Decreased activity tolerance, Decreased mobility, Improper body mechanics, Impaired perceived functional ability, Postural dysfunction, Impaired flexibility, Decreased range of motion  Visit Diagnosis: Cervicalgia  Chronic right shoulder pain  Muscle weakness (generalized)  Abnormal posture     Problem List Patient Active Problem List   Diagnosis Date Noted   Tobacco abuse    Esophageal reflux    Chest pain with moderate risk for cardiac etiology 07/01/2015   TOBACCO ABUSE 05/26/2010   DM2 (diabetes mellitus, type 2) ([place]  05/24/2010   HYPERLIPIDEMIA, MIXED 05/24/2010   ALLERGIC RHINITIS 05/24/2010   DYSPAREUNIA 05/24/2010   SHOULDER PAIN 05/24/2010   MYALGIA 05/24/2010   UNSPECIFIED SLEEP DISTURBANCE 05/24/2010   CHEST PAIN 05/24/2010   NAUSEA 05/24/2010   ABDOMINAL PAIN RIGHT LOWER QUADRANT 05/24/2010   MScot Jun PT, DPT, OCS, ATC 04/08/21  8:39 AM  PHYSICAL THERAPY DISCHARGE SUMMARY  Visits from Start of Care: 2  Current functional level related to goals / functional outcomes: See note   Remaining deficits: seenote   Education / Equipment: HEP   Patient agrees to discharge. Patient goals were partially met. Patient is being discharged due to not returning since the last visit.  MScot Jun PT, DPT, OCS, ATC 05/25/21  10:41 AM     [facility] [address] [place] [address]Phone: [phone]  Fax:  [phone] Name: Kristin CAUDILLMRN: [id number] of Birth: 126-Dec-1962

## 2021-04-14 ENCOUNTER — Ambulatory Visit: Payer: BC Managed Care – PPO | Admitting: Orthopaedic Surgery

## 2021-04-14 ENCOUNTER — Other Ambulatory Visit: Payer: Self-pay

## 2021-04-14 ENCOUNTER — Encounter: Payer: Self-pay | Admitting: Orthopaedic Surgery

## 2021-04-14 DIAGNOSIS — M25511 Pain in right shoulder: Secondary | ICD-10-CM | POA: Diagnosis not present

## 2021-04-14 DIAGNOSIS — M542 Cervicalgia: Secondary | ICD-10-CM | POA: Diagnosis not present

## 2021-04-14 NOTE — Progress Notes (Signed)
The patient returns after having been to physical therapy for her cervical spine and right shoulder radicular symptoms.  She states that may have not helped her much at all.  She still getting pain in the parascapular area in the trapezius area as well as right-sided neck pain.  This is all on the right side.  It does radiate down into her hand.  She has had dry needling.  She is been on anti-inflammatories.  Her last hemoglobin A1c about 6 months ago was 8.8.  She does report better blood glucose control.  However, the radicular symptoms are continuing and they are not improving.  At this point a MRI of the cervical spine is warranted to assess for nerve compression to the right side that is contributing to the radicular symptoms and can help guide further treatment options.  Given the fact that she has tried and failed conservative treatment for over 6 months now including therapy, the MRI is warranted.  All questions and concerns were answered and addressed.

## 2021-04-15 ENCOUNTER — Encounter: Payer: BC Managed Care – PPO | Admitting: Rehabilitative and Restorative Service Providers"

## 2021-04-22 ENCOUNTER — Encounter: Payer: BC Managed Care – PPO | Admitting: Rehabilitative and Restorative Service Providers"

## 2021-04-28 ENCOUNTER — Ambulatory Visit: Payer: BC Managed Care – PPO | Admitting: Orthopaedic Surgery

## 2021-04-28 ENCOUNTER — Encounter: Payer: BC Managed Care – PPO | Admitting: Rehabilitative and Restorative Service Providers"

## 2021-04-28 ENCOUNTER — Telehealth: Payer: Self-pay | Admitting: Orthopaedic Surgery

## 2021-04-28 DIAGNOSIS — M542 Cervicalgia: Secondary | ICD-10-CM

## 2021-04-28 NOTE — Addendum Note (Signed)
Addended by: Barbette Or on: 04/28/2021 09:03 AM   Modules accepted: Orders

## 2021-04-28 NOTE — Telephone Encounter (Signed)
Pt called and states she needed to cancel her appt today because she was suppose to have a MRI done before she came back. I do not see a referral in there for a MRI.   CB [phone]

## 2021-04-28 NOTE — Telephone Encounter (Signed)
MRI ordered

## 2021-05-06 ENCOUNTER — Other Ambulatory Visit: Payer: Self-pay

## 2021-05-06 ENCOUNTER — Ambulatory Visit
Admission: RE | Admit: 2021-05-06 | Discharge: 2021-05-06 | Disposition: A | Discharge status: Home / Self Care | Payer: BC Managed Care – PPO | Source: Ambulatory Visit | Attending: Orthopaedic Surgery | Admitting: Orthopaedic Surgery

## 2021-05-06 DIAGNOSIS — M542 Cervicalgia: Secondary | ICD-10-CM

## 2021-05-18 ENCOUNTER — Encounter: Payer: Self-pay | Admitting: Orthopaedic Surgery

## 2021-05-18 ENCOUNTER — Ambulatory Visit: Payer: BC Managed Care – PPO | Admitting: Orthopaedic Surgery

## 2021-05-18 ENCOUNTER — Other Ambulatory Visit: Payer: Self-pay

## 2021-05-18 DIAGNOSIS — M792 Neuralgia and neuritis, unspecified: Secondary | ICD-10-CM | POA: Diagnosis not present

## 2021-05-18 DIAGNOSIS — M542 Cervicalgia: Secondary | ICD-10-CM | POA: Diagnosis not present

## 2021-05-18 NOTE — Progress Notes (Signed)
The patient comes in today to go over a MRI of her cervical spine.  She is a thin 60 year old woman who has been having right-sided neck pain but also radicular symptoms with numbness and tingling going down her right shoulder and into her trapezius area.  I have provided a trigger point injection for her right shoulder and that helped some.  She is a diabetic and has not been under good blood glucose control either.  She says the numbness and tingling going down her right arm into the right hand is worsening and her right-sided neck pain is worsening.  The MRI was reviewed and compared to a previous MRI from 2016 of her cervical spine.  On exam her right shoulder exam is normal.  She does have weak triceps on the right side and weak grip strength.  She has neck pain to the right side as well as a positive Spurling sign to the right side.  The MRI shows a small right disc herniation that is new since 2016 but she also has worsening facet degeneration with foraminal stenosis more to the left side.  Given her continued radicular symptoms combined with the MRI findings of her cervical spine, I would like to send her to neurosurgery for a potential surgical evaluation.  I counseled her extensively about her blood glucose control as well.  She agrees with this referral.  All question concerns were answered addressed.

## 2021-05-19 ENCOUNTER — Other Ambulatory Visit: Payer: Self-pay

## 2021-05-19 DIAGNOSIS — M542 Cervicalgia: Secondary | ICD-10-CM

## 2021-05-19 DIAGNOSIS — M792 Neuralgia and neuritis, unspecified: Secondary | ICD-10-CM
